# Patient Record
Sex: Female | Born: 2005 | Race: Black or African American | Hispanic: No | Marital: Single | Smoking: Never smoker
Health system: Southern US, Community
[De-identification: ages and names within clinical notes are randomized; demographics above are authoritative.]

---
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
## 2023-06-02 ENCOUNTER — Ambulatory Visit
Admission: EM | Admit: 2023-06-02 | Discharge: 2023-06-02 | Disposition: A | Discharge status: Home / Self Care | Payer: Medicaid Other | Attending: Internal Medicine | Admitting: Internal Medicine

## 2023-06-02 DIAGNOSIS — R112 Nausea with vomiting, unspecified: Secondary | ICD-10-CM

## 2023-06-02 DIAGNOSIS — B349 Viral infection, unspecified: Secondary | ICD-10-CM

## 2023-06-02 DIAGNOSIS — R6883 Chills (without fever): Secondary | ICD-10-CM | POA: Insufficient documentation

## 2023-06-02 LAB — POCT INFLUENZA A/B
Influenza A, POC: NEGATIVE
Influenza B, POC: NEGATIVE

## 2023-06-02 LAB — POCT RAPID STREP A (OFFICE): Rapid Strep A Screen: NEGATIVE

## 2023-06-02 MED ORDER — ONDANSETRON 4 MG PO TBDP: 4.0000 mg | ORAL_TABLET | Freq: Three times a day (TID) | ORAL | 0 refills | Status: DC | PRN

## 2023-06-02 NOTE — ED Triage Notes (Signed)
Pt states that she has some chills, headache, nausea and nasal congestion. [id number]

## 2023-06-02 NOTE — ED Provider Notes (Signed)
[name]    CSN: [id number] Arrival date & time: 06/02/23  1340      History   Chief Complaint Chief Complaint  Patient presents with   Chills    Chills, headache, nausea and nasal congestion. X2-3 days    HPI Renee [name] is a 17 y.o. female.   Patient presents with chills, headache, nausea, vomiting, nasal congestion, cough that started about 2 to 3 days ago.  Denies any documented fevers at home.  Reports that several people in her dorm at college have had similar symptoms.  She denies diarrhea, abdominal pain, chest pain, shortness of breath.  She has taken over-the-counter cold and flu medication for symptoms.  Reports that she has been able to tolerate fluids but has had decreased appetite.     History reviewed. No pertinent past medical history.  There are no problems to display for this patient.   History reviewed. No pertinent surgical history.  OB History   No obstetric history on file.      Home Medications    Prior to Admission medications   Medication Sig Start Date End Date Taking? Authorizing Provider  Acetaminophen (TYLENOL PO) Take 1 tablet by mouth 2 (two) times daily as needed (back pain).   Yes [provider]  Norethindrone-Ethinyl Estradiol-Fe Biphas (LO LOESTRIN FE) 1 MG-10 MCG / 10 MCG tablet Take 1 tablet by mouth daily. 03/24/22  Yes [provider]  ondansetron (ZOFRAN-ODT) 4 MG disintegrating tablet Take 1 tablet (4 mg total) by mouth every 8 (eight) hours as needed for nausea or vomiting. 06/02/23  Yes Hailei Besser, Acie Fredrickson, FNP  ibuprofen (CHILDRENS MOTRIN) 50 MG chewable tablet Chew 2 tablets (100 mg total) by mouth every 8 (eight) hours as needed for pain. 06/01/14   Fayrene Helper, PA-C    Family History History reviewed. No pertinent family history.  Social History Social History   Tobacco Use   Smoking status: Never  Vaping Use   Vaping status: Every Day  Substance Use Topics   Alcohol use: No    Drug use: No     Allergies   Patient has no known allergies.   Review of Systems Review of Systems Per HPI  Physical Exam Triage Vital Signs ED Triage Vitals  Encounter Vitals Group     BP 06/02/23 1457 130/80     Systolic BP Percentile --      Diastolic BP Percentile --      Pulse Rate 06/02/23 1457 67     Resp 06/02/23 1457 19     Temp 06/02/23 1457 98.1 F (36.7 C)     Temp Source 06/02/23 1457 Oral     SpO2 06/02/23 1457 100 %     Weight 06/02/23 1454 117 lb 12.8 oz (53.4 kg)     Height 06/02/23 1454 5\' 3"  (1.6 m)     Head Circumference --      Peak Flow --      Pain Score 06/02/23 1454 0     Pain Loc --      Pain Education --      Exclude from Growth Chart --    No data found.  Updated Vital Signs BP 130/80 (BP Location: Left Arm)   Pulse 67   Temp 98.1 F (36.7 C) (Oral)   Resp 19   Ht 5\' 3"  (1.6 m)   Wt 117 lb 12.8 oz (53.4 kg)   LMP 05/01/2023   SpO2 100%   BMI 20.87 kg/m  Visual Acuity Right Eye Distance:   Left Eye Distance:   Bilateral Distance:    Right Eye Near:   Left Eye Near:    Bilateral Near:     Physical Exam Constitutional:      General: She is not in acute distress.    Appearance: Normal appearance. She is not toxic-appearing or diaphoretic.  HENT:     Head: Normocephalic and atraumatic.     Right Ear: Tympanic membrane and ear canal normal.     Left Ear: Tympanic membrane and ear canal normal.     Nose: Congestion present.     Mouth/Throat:     Mouth: Mucous membranes are moist.     Pharynx: Posterior oropharyngeal erythema present. No oropharyngeal exudate.     Tonsils: No tonsillar exudate or tonsillar abscesses. 1+ on the right. 1+ on the left.  Eyes:     Extraocular Movements: Extraocular movements intact.     Conjunctiva/sclera: Conjunctivae normal.     Pupils: Pupils are equal, round, and reactive to light.  Cardiovascular:     Rate and Rhythm: Normal rate and regular rhythm.     Pulses: Normal pulses.      Heart sounds: Normal heart sounds.  Pulmonary:     Effort: Pulmonary effort is normal. No respiratory distress.     Breath sounds: Normal breath sounds. No wheezing.  Abdominal:     General: Abdomen is flat. Bowel sounds are normal.     Palpations: Abdomen is soft.  Musculoskeletal:        General: Normal range of motion.     Cervical back: Normal range of motion.  Skin:    General: Skin is warm and dry.  Neurological:     General: No focal deficit present.     Mental Status: She is alert and oriented to person, place, and time. Mental status is at baseline.  Psychiatric:        Mood and Affect: Mood normal.        Behavior: Behavior normal.      UC Treatments / Results  Labs (all labs ordered are listed, but only abnormal results are displayed) Labs Reviewed  POCT INFLUENZA A/B  POCT RAPID STREP A (OFFICE)    EKG   Radiology No results found.  Procedures Procedures (including critical care time)  Medications Ordered in UC Medications - No data to display  Initial Impression / Assessment and Plan / UC Course  I have reviewed the triage vital signs and the nursing notes.  Pertinent labs & imaging results that were available during my care of the patient were reviewed by me and considered in my medical decision making (see chart for details).     Suspect viral cause to patient's symptoms.  Rapid strep was completed given erythema noted on physical exam to posterior pharynx..  It was negative but throat culture is pending.  Rapid flu is negative.  Parent reported negative COVID testing at home so this testing was deferred.  Ondansetron prescribed to take as needed for nausea.  Advised adequate fluid hydration, rest, symptom management.  No signs of dehydration or acute abdomen on exam that would warrant emergent evaluation.  Advised strict return and ER precautions.  Patient and parent verbalized understanding and were agreeable with plan. Final Clinical Impressions(s)  / UC Diagnoses   Final diagnoses:  Viral illness  Nausea and vomiting, unspecified vomiting type     Discharge Instructions      Flu and rapid strep are negative.  Throat culture is pending.  Will call if it is abnormal.  As we discussed, your symptoms appear viral and should run their course.  I have prescribed you nausea medication to take as needed.  Push oral fluids and follow-up if any symptoms persist or worsen.     ED Prescriptions     Medication Sig Dispense Auth. Provider   ondansetron (ZOFRAN-ODT) 4 MG disintegrating tablet Take 1 tablet (4 mg total) by mouth every 8 (eight) hours as needed for nausea or vomiting. 20 tablet [place], Acie Fredrickson, [place]      PDMP not reviewed this encounter.   Gustavus Bryant, [place] 06/02/23 [phone]

## 2023-06-02 NOTE — Discharge Instructions (Signed)
Flu and rapid strep are negative.  Throat culture is pending.  Will call if it is abnormal.  As we discussed, your symptoms appear viral and should run their course.  I have prescribed you nausea medication to take as needed.  Push oral fluids and follow-up if any symptoms persist or worsen.

## 2023-06-05 LAB — CULTURE, GROUP A STREP (THRC)

## 2023-06-11 NOTE — Plan of Care (Signed)
[facility] Tonsillectomy/Adenoidectomy, Postoperative PEDS care plan entered in error.

## 2024-06-18 ENCOUNTER — Emergency Department (HOSPITAL_BASED_OUTPATIENT_CLINIC_OR_DEPARTMENT_OTHER)
Admission: EM | Admit: 2024-06-18 | Discharge: 2024-06-18 | Disposition: A | Discharge status: Home / Self Care | Attending: Emergency Medicine | Admitting: Emergency Medicine

## 2024-06-18 ENCOUNTER — Encounter (HOSPITAL_BASED_OUTPATIENT_CLINIC_OR_DEPARTMENT_OTHER): Payer: Self-pay | Admitting: Emergency Medicine

## 2024-06-18 DIAGNOSIS — B2790 Infectious mononucleosis, unspecified without complication: Secondary | ICD-10-CM | POA: Diagnosis not present

## 2024-06-18 DIAGNOSIS — J029 Acute pharyngitis, unspecified: Secondary | ICD-10-CM | POA: Diagnosis present

## 2024-06-18 LAB — CBC WITH DIFFERENTIAL/PLATELET
Abs Immature Granulocytes: 0.09 K/uL — ABNORMAL HIGH (ref 0.00–0.07)
Basophils Absolute: 0.1 K/uL (ref 0.0–0.1)
Basophils Relative: 1 %
Eosinophils Absolute: 0 K/uL (ref 0.0–0.5)
Eosinophils Relative: 0 %
HCT: 41.8 % (ref 36.0–46.0)
Hemoglobin: 14 g/dL (ref 12.0–15.0)
Immature Granulocytes: 1 %
Lymphocytes Relative: 50 %
Lymphs Abs: 9.1 K/uL — ABNORMAL HIGH (ref 0.7–4.0)
MCH: 29 pg (ref 26.0–34.0)
MCHC: 33.5 g/dL (ref 30.0–36.0)
MCV: 86.7 fL (ref 80.0–100.0)
Monocytes Absolute: 1.8 K/uL — ABNORMAL HIGH (ref 0.1–1.0)
Monocytes Relative: 10 %
Neutro Abs: 6.7 K/uL (ref 1.7–7.7)
Neutrophils Relative %: 38 %
Platelets: 205 K/uL (ref 150–400)
RBC: 4.82 MIL/uL (ref 3.87–5.11)
RDW: 12.2 % (ref 11.5–15.5)
Smear Review: NORMAL
WBC: 17.7 K/uL — ABNORMAL HIGH (ref 4.0–10.5)
nRBC: 0 % (ref 0.0–0.2)

## 2024-06-18 LAB — BASIC METABOLIC PANEL WITH GFR
Anion gap: 13 (ref 5–15)
BUN: 7 mg/dL (ref 6–20)
CO2: 22 mmol/L (ref 22–32)
Calcium: 8.9 mg/dL (ref 8.9–10.3)
Chloride: 103 mmol/L (ref 98–111)
Creatinine, Ser: 0.52 mg/dL (ref 0.44–1.00)
GFR, Estimated: >60 mL/min (ref >60)
Glucose, Bld: 116 mg/dL — ABNORMAL HIGH (ref 70–99)
Potassium: 3.6 mmol/L (ref 3.5–5.1)
Sodium: 138 mmol/L (ref 135–145)

## 2024-06-18 LAB — COMPREHENSIVE METABOLIC PANEL WITH GFR
ALT: 72 U/L — ABNORMAL HIGH (ref 0–44)
AST: 67 U/L — ABNORMAL HIGH (ref 15–41)
Albumin: 4.4 g/dL (ref 3.5–5.0)
Alkaline Phosphatase: 98 U/L (ref 38–126)
Anion gap: 17 — ABNORMAL HIGH (ref 5–15)
BUN: 9 mg/dL (ref 6–20)
CO2: 23 mmol/L (ref 22–32)
Calcium: 9.4 mg/dL (ref 8.9–10.3)
Chloride: 99 mmol/L (ref 98–111)
Creatinine, Ser: 0.58 mg/dL (ref 0.44–1.00)
GFR, Estimated: >60 mL/min (ref >60)
Glucose, Bld: 91 mg/dL (ref 70–99)
Potassium: 3.6 mmol/L (ref 3.5–5.1)
Sodium: 139 mmol/L (ref 135–145)
Total Bilirubin: 0.5 mg/dL (ref 0.0–1.2)
Total Protein: 9 g/dL — ABNORMAL HIGH (ref 6.5–8.1)

## 2024-06-18 LAB — RESP PANEL BY RT-PCR (RSV, FLU A&B, COVID)  RVPGX2
Influenza A by PCR: NEGATIVE
Influenza B by PCR: NEGATIVE
Resp Syncytial Virus by PCR: NEGATIVE
SARS Coronavirus 2 by RT PCR: NEGATIVE

## 2024-06-18 LAB — HCG, SERUM, QUALITATIVE: Preg, Serum: NEGATIVE

## 2024-06-18 LAB — LIPASE, BLOOD: Lipase: 21 U/L (ref 11–51)

## 2024-06-18 LAB — GROUP A STREP BY PCR: Group A Strep by PCR: NOT DETECTED

## 2024-06-18 MED ORDER — DEXAMETHASONE 4 MG PO TABS: 4.0000 mg | ORAL_TABLET | Freq: Two times a day (BID) | ORAL | 0 refills | Status: DC

## 2024-06-18 MED ORDER — KETOROLAC TROMETHAMINE 15 MG/ML IJ SOLN
15.0000 mg | Freq: Once | INTRAMUSCULAR | Status: AC
  Administered 2024-06-18: 15 mg via INTRAVENOUS
  Filled 2024-06-18: qty 1

## 2024-06-18 MED ORDER — LIDOCAINE VISCOUS HCL 2 % MT SOLN
15.0000 mL | Freq: Once | OROMUCOSAL | Status: AC
  Administered 2024-06-18: 15 mL via OROMUCOSAL
  Filled 2024-06-18: qty 15

## 2024-06-18 MED ORDER — ACETAMINOPHEN 500 MG PO TABS
1000.0000 mg | ORAL_TABLET | Freq: Once | ORAL | Status: AC
Start: 2024-06-18 — End: 2024-06-18
  Administered 2024-06-18: 1000 mg via ORAL
  Filled 2024-06-18: qty 2

## 2024-06-18 MED ORDER — ONDANSETRON HCL 4 MG PO TABS: 4.0000 mg | ORAL_TABLET | Freq: Three times a day (TID) | ORAL | 0 refills | Status: AC | PRN

## 2024-06-18 MED ORDER — DEXAMETHASONE SOD PHOSPHATE PF 10 MG/ML IJ SOLN
10.0000 mg | Freq: Once | INTRAMUSCULAR | Status: AC
  Administered 2024-06-18: 10 mg via INTRAVENOUS

## 2024-06-18 MED ORDER — METRONIDAZOLE 500 MG PO TABS: 500.0000 mg | ORAL_TABLET | Freq: Two times a day (BID) | ORAL | 0 refills | Status: DC

## 2024-06-18 MED ORDER — LACTATED RINGERS IV BOLUS
1000.0000 mL | Freq: Once | INTRAVENOUS | Status: AC
  Administered 2024-06-18: 1000 mL via INTRAVENOUS

## 2024-06-18 MED ORDER — ONDANSETRON HCL 4 MG/2ML IJ SOLN
4.0000 mg | Freq: Once | INTRAMUSCULAR | Status: AC
  Administered 2024-06-18: 4 mg via INTRAVENOUS
  Filled 2024-06-18: qty 2

## 2024-06-18 NOTE — ED Notes (Addendum)
 Patient updated on continued plan of care. Denies additional needs. Bed in lowest position and call light within reach.

## 2024-06-18 NOTE — ED Provider Notes (Signed)
 [name] Provider Note   CSN: [id number] Arrival date & time: 06/18/24  1015     History Chief Complaint  Patient presents with   Sore Throat    HPI: Renee [name] is a 18 y.o. female with no pertinent history who presents complaining of multiple symptoms. Patient arrived via POV accompanied by mother.  History provided by patient and parent.  No interpreter required during this encounter.  Patient reports that she has had several days of sore throat, headache, nausea, vomiting, epigastric discomfort.  Denies chest pain, shortness of breath, diarrhea.  Denies ear pain.  Does report that she has some cervical lymphadenopathy.  Reports that she recently was prescribed Flagyl for bacterial vaginosis and trichomoniasis, however has not Down all doses of this medication.  Does not have any chronic conditions.  Reports that she has difficulty tolerating liquids due to frequency of nausea and vomiting.  Patient's recorded medical, surgical, social, medication list and allergies were reviewed in the Snapshot window as part of the initial history.   Prior to Admission medications   Medication Sig Start Date End Date Taking? Authorizing Provider  dexamethasone (DECADRON) 4 MG tablet Take 1 tablet (4 mg total) by mouth 2 (two) times daily with a meal for 5 days. 06/18/24 06/23/24 Yes Renee Jerilynn RAMAN, MD  metroNIDAZOLE (FLAGYL) 500 MG tablet Take 1 tablet (500 mg total) by mouth 2 (two) times daily. 06/18/24  Yes Renee Jerilynn RAMAN, MD  ondansetron  (ZOFRAN ) 4 MG tablet Take 1 tablet (4 mg total) by mouth every 8 (eight) hours as needed for nausea or vomiting. 06/18/24  Yes Renee Jerilynn RAMAN, MD  Acetaminophen (TYLENOL PO) Take 1 tablet by mouth 2 (two) times daily as needed (back pain).    [provider]  ibuprofen  (CHILDRENS MOTRIN ) 50 MG chewable tablet Chew 2 tablets (100 mg total) by mouth every 8 (eight) hours as needed for pain. 06/01/14    Renee Colon, PA-C  [name] (LO LOESTRIN FE) 1 MG-10 MCG / 10 MCG tablet Take 1 tablet by mouth daily. 03/24/22   [provider]  ondansetron  (ZOFRAN -ODT) 4 MG disintegrating tablet Take 1 tablet (4 mg total) by mouth every 8 (eight) hours as needed for nausea or vomiting. 06/02/23   Renee [name], Renee [name], Renee [name]     Allergies: Patient has no known allergies.   Review of Systems   ROS as per HPI  Physical Exam Updated Vital Signs BP 126/76 (BP Location: Right Arm)   Pulse 66   Temp 97.6 F (36.4 C) (Oral)   Resp 16   Wt 51.7 kg   LMP 05/22/2024   SpO2 98%   BMI 20.19 kg/m  Physical Exam Vitals and nursing note reviewed.  Constitutional:      General: She is not in acute distress.    Appearance: She is well-developed.  HENT:     Head: Normocephalic and atraumatic.     Right Ear: Tympanic membrane normal.     Left Ear: Tympanic membrane normal.     Mouth/Throat:     Pharynx: Posterior oropharyngeal erythema present.     Tonsils: Tonsillar exudate present. 4+ on the right. 4+ on the left.  Eyes:     Conjunctiva/sclera: Conjunctivae normal.  Cardiovascular:     Rate and Rhythm: Normal rate and regular rhythm.     Heart sounds: No murmur heard. Pulmonary:     Effort: Pulmonary effort is normal. No respiratory distress.  Breath sounds: Normal breath sounds.  Abdominal:     Palpations: Abdomen is soft.     Tenderness: There is no abdominal tenderness.  Musculoskeletal:        General: No swelling.     Cervical back: Neck supple.  Skin:    General: Skin is warm and dry.     Capillary Refill: Capillary refill takes less than 2 seconds.  Neurological:     Mental Status: She is alert.  Psychiatric:        Mood and Affect: Mood normal.     ED Course/ Medical Decision Making/ A&P    Procedures Procedures   Medications Ordered in ED Medications  acetaminophen (TYLENOL) tablet 1,000 mg (1,000 mg Oral Given 06/18/24 1156)   ondansetron  (ZOFRAN ) injection 4 mg (4 mg Intravenous Given 06/18/24 1135)  lactated ringers bolus 1,000 mL (0 mLs Intravenous Stopped 06/18/24 1244)  dexamethasone (DECADRON) injection 10 mg (10 mg Intravenous Given 06/18/24 1137)  lidocaine (XYLOCAINE) 2 % viscous mouth solution 15 mL (15 mLs Mouth/Throat Given 06/18/24 1123)  lactated ringers bolus 1,000 mL (0 mLs Intravenous Stopped 06/18/24 1400)  ketorolac (TORADOL) 15 MG/ML injection 15 mg (15 mg Intravenous Given 06/18/24 1235)    Medical Decision Making:   Renee [name] is a 18 y.o. female who presents for multiple symptoms as per above.  Physical exam is pertinent for 4+ tonsils with significant erythema and exudates.   The differential includes but is not limited to strep pharyngitis, viral pharyngitis, dehydration, AKI.  Independent historian: Parent  External data reviewed: Notes: Reviewed patient's outpatient urgent care notes, patient unfortunately had negative strep test yesterday as well as positive mononucleosis test  Initial Plan:   Screening labs including CBC and Metabolic panel to evaluate for infectious or metabolic etiology of disease.  Strep swab, COVID/flu/RSV   Labs: Ordered, Independent interpretation, and Details: Strep test negative, serum pregnancy negative.  CBC with leukocytosis to 17.7, hemoglobin of 14, no thrombocytopenia. Patient has a history of mild anemia with baseline hemoglobin of approximately 12, therefore do feel that there is likely component of hemoconcentration contributing to patient's leukocytosis given patient also has increased hemoglobin from baseline.  Abnormal white blood cells noted on smear are likely secondary to known mononucleosis infection.  CMP with anion gap elevation, however no AKI, emergent electro gentian, emergent LFT abnormality.  Lipase WNL.  COVID/flu/RSV negative.  Radiology: Not indicated No results found.  EKG/Medicine tests: Not indicated EKG Interpretation:                   Interventions: Viscous lidocaine, Zofran , Decadron, Tylenol, Toradol, 2 L LR bolus  See the EMR for full details regarding lab and imaging results.  Patient with numerous symptoms consistent with potential viral etiology, has significant tonsillomegaly likely contributing to patient's sore throat.  Will treat symptomatically with lidocaine, Decadron, Tylenol.  Given patient reports that she has not been tolerating p.o., will obtain labs and give fluid bolus.  Unfortunately patient had strep screen ordered in triage prior to me being able to evaluate patient's recent outside notes which do note that patient had negative strep test on 10/30 and positive mononucleosis screen, thus patient's tonsillar symptoms are most likely due to mononucleosis, as well as her systemic symptoms.  Labs obtained and patient does have significant anion gap elevation, therefore additional LR bolus ordered.  Patient CBC also demonstrates evidence of hemoconcentration consistent with dehydration.  Patient with symptomatic improvement after multimodal pain therapy, and p.o. was trialed with  success.  Patient continues to have significant tonsillomegaly, therefore will discharge with Decadron to continue to manage symptoms, as well as Zofran  for nausea/vomiting.  Recommended pain control otherwise with Tylenol and ibuprofen .  Patient does not have any unilateral symptoms concerning for PTA, RPA, therefore do not feel the patient warrants additional imaging at this time.  Discussed contact sport precautions given association of splenomegaly/splenic rupture with mononucleosis, and patient and daughter expressed understanding.  Feel that patient is stable for discharge and outpatient management, with follow-up with PCP.  Additionally will represcribe patient's course of Flagyl for her recent trichomoniasis/bacterial vaginosis diagnosis given she was not able to take the Flagyl as prescribed due to her  nausea/vomiting.  Presentation is most consistent with acute complicated illness and I did consider and rule out acute life/limb-threatening illness  Discussion of management or test interpretations with external provider(s): Not indicated  Risk Drugs:OTC drugs and Prescription drug management  Disposition: DISCHARGE: I believe that the patient is safe for discharge home with outpatient follow-up. Patient was informed of all pertinent physical exam, laboratory, and imaging findings. Patient's suspected etiology of their symptom presentation was discussed with the patient and all questions were answered. We discussed following up with PCP. I provided thorough ED return precautions. The patient feels safe and comfortable with this plan.  MDM generated using voice dictation software and may contain dictation errors.  Please contact me for any clarification or with any questions.  Clinical Impression:  1. Mononucleosis syndrome      Discharge   Final Clinical Impression(s) / ED Diagnoses Final diagnoses:  Mononucleosis syndrome    Rx / DC Orders ED Discharge Orders          Ordered    dexamethasone (DECADRON) 4 MG tablet  2 times daily with meals        06/18/24 1509    ondansetron  (ZOFRAN ) 4 MG tablet  Every 8 hours PRN        06/18/24 1509    metroNIDAZOLE (FLAGYL) 500 MG tablet  2 times daily        06/18/24 1510             Renee Jerilynn RAMAN, MD 06/18/24 1531

## 2024-06-18 NOTE — Discharge Instructions (Addendum)
 [name]  Thank you for allowing us  to take care of you today.  You came to the Emergency Department today because you came in with several symptoms including sore throat, headache, and persistent nausea and vomiting.  You were negative for strep throat here as well as COVID, flu, RSV.  It looks like you were recently positive for mononucleosis on 10/30 at urgent care.  This is likely the cause of all of your symptoms.  You are initially dehydrated, however you did not have any abnormal levels of your blood salts, and your kidney number was normal.  We gave you fluids and you are feeling much better and your metabolic panel normalized.  You are feeling better after some Tylenol, NSAIDs, and some steroids to help decrease the swelling in your tonsils.  We are going to prescribe you a steroid pack to help control the swelling in your tonsils over the next Avril days.  As well as a prescription for Zofran , which is a medication that will help reduce nausea and vomiting.  Please be aware that the steroid can decrease the efficacy of your birth control, therefore you should use a backup method such as condoms until you finish your steroids and for at least 1 week after.  We are also represcribing your Flagyl given you did not tolerate the full course that you are previously prescribed.  Your symptoms will likely last for several more days or even weeks as mono can take a long time to improve.  Please also be aware that in addition to making your tonsils large it can cause enlargement of your spleen, therefore you should not participate in any contact sports until your symptoms have fully resolved for a few weeks.  If you participate in contact sports and then have severe abdominal pain, that would be a reason to come back to the emergency department.  To-Do: 1. Please follow-up with your primary doctor within 1 - 2 weeks / as soon as possible.    Please return to the Emergency Department or call 911  if you experience have worsening of your symptoms, or do not get better, inability to drink water by mouth despite Zofran , steroids, Tylenol and ibuprofen , chest pain, shortness of breath, severe or significantly worsening pain, high fever, severe confusion, pass out or have any reason to think that you need emergency medical care.   We hope you feel better soon.   Mitzie Later, MD Department of Emergency Medicine [facility]

## 2024-06-18 NOTE — ED Notes (Signed)
 Patient tolerated PO fluid challenge well. Denies any episodes of nausea or vomiting.

## 2024-06-18 NOTE — ED Triage Notes (Signed)
 Pt c/o sore throat x 6 days, endorse difficulty  swallowing, able to swallow secretions. Motrin  pta, 400 mg

## 2024-06-20 ENCOUNTER — Emergency Department (HOSPITAL_COMMUNITY)

## 2024-06-20 ENCOUNTER — Other Ambulatory Visit: Payer: Self-pay

## 2024-06-20 ENCOUNTER — Inpatient Hospital Stay (HOSPITAL_COMMUNITY)
Admission: EM | Admit: 2024-06-20 | Discharge: 2024-06-24 | DRG: 155 | Disposition: A | Discharge status: Home / Self Care | Attending: Internal Medicine | Admitting: Internal Medicine

## 2024-06-20 DIAGNOSIS — Z5320 Procedure and treatment not carried out because of patient's decision for unspecified reasons: Secondary | ICD-10-CM | POA: Diagnosis present

## 2024-06-20 DIAGNOSIS — Z793 Long term (current) use of hormonal contraceptives: Secondary | ICD-10-CM

## 2024-06-20 DIAGNOSIS — E861 Hypovolemia: Secondary | ICD-10-CM | POA: Diagnosis present

## 2024-06-20 DIAGNOSIS — D72829 Elevated white blood cell count, unspecified: Secondary | ICD-10-CM | POA: Diagnosis present

## 2024-06-20 DIAGNOSIS — B2799 Infectious mononucleosis, unspecified with other complication: Principal | ICD-10-CM | POA: Diagnosis present

## 2024-06-20 DIAGNOSIS — R0603 Acute respiratory distress: Secondary | ICD-10-CM | POA: Diagnosis present

## 2024-06-20 DIAGNOSIS — R7401 Elevation of levels of liver transaminase levels: Secondary | ICD-10-CM | POA: Diagnosis present

## 2024-06-20 DIAGNOSIS — J353 Hypertrophy of tonsils with hypertrophy of adenoids: Principal | ICD-10-CM | POA: Diagnosis present

## 2024-06-20 DIAGNOSIS — J988 Other specified respiratory disorders: Secondary | ICD-10-CM

## 2024-06-20 DIAGNOSIS — J039 Acute tonsillitis, unspecified: Secondary | ICD-10-CM | POA: Diagnosis present

## 2024-06-20 DIAGNOSIS — E871 Hypo-osmolality and hyponatremia: Secondary | ICD-10-CM | POA: Diagnosis present

## 2024-06-20 DIAGNOSIS — Z79899 Other long term (current) drug therapy: Secondary | ICD-10-CM

## 2024-06-20 DIAGNOSIS — R590 Localized enlarged lymph nodes: Secondary | ICD-10-CM | POA: Diagnosis present

## 2024-06-20 DIAGNOSIS — T380X5A Adverse effect of glucocorticoids and synthetic analogues, initial encounter: Secondary | ICD-10-CM | POA: Diagnosis present

## 2024-06-20 DIAGNOSIS — B2789 Other infectious mononucleosis with other complication: Principal | ICD-10-CM | POA: Diagnosis present

## 2024-06-20 LAB — BASIC METABOLIC PANEL WITH GFR
Anion gap: 11 (ref 5–15)
BUN: 7 mg/dL (ref 6–20)
CO2: 24 mmol/L (ref 22–32)
Calcium: 8.9 mg/dL (ref 8.9–10.3)
Chloride: 97 mmol/L — ABNORMAL LOW (ref 98–111)
Creatinine, Ser: 0.61 mg/dL (ref 0.44–1.00)
GFR, Estimated: >60 mL/min (ref >60)
Glucose, Bld: 93 mg/dL (ref 70–99)
Potassium: 4.6 mmol/L (ref 3.5–5.1)
Sodium: 133 mmol/L — ABNORMAL LOW (ref 135–145)

## 2024-06-20 LAB — CBC WITH DIFFERENTIAL/PLATELET
Basophils Absolute: 0.3 K/uL — ABNORMAL HIGH (ref 0.0–0.1)
Basophils Relative: 1 %
Eosinophils Absolute: 0 K/uL (ref 0.0–0.5)
Eosinophils Relative: 0 %
HCT: 42.5 % (ref 36.0–46.0)
Hemoglobin: 13.4 g/dL (ref 12.0–15.0)
Lymphocytes Relative: 38 %
Lymphs Abs: 10.3 K/uL — ABNORMAL HIGH (ref 0.7–4.0)
MCH: 28.1 pg (ref 26.0–34.0)
MCHC: 31.5 g/dL (ref 30.0–36.0)
MCV: 89.1 fL (ref 80.0–100.0)
Monocytes Absolute: 0.3 K/uL (ref 0.1–1.0)
Monocytes Relative: 1 %
Neutro Abs: 16.3 K/uL — ABNORMAL HIGH (ref 1.7–7.7)
Neutrophils Relative %: 60 %
Platelets: 245 K/uL (ref 150–400)
RBC: 4.77 MIL/uL (ref 3.87–5.11)
RDW: 12.7 % (ref 11.5–15.5)
WBC: 27.2 K/uL — ABNORMAL HIGH (ref 4.0–10.5)
nRBC: 0 % (ref 0.0–0.2)

## 2024-06-20 LAB — HCG, SERUM, QUALITATIVE: Preg, Serum: NEGATIVE

## 2024-06-20 LAB — PATHOLOGIST SMEAR REVIEW: Path Review: REACTIVE

## 2024-06-20 MED ORDER — RACEPINEPHRINE HCL 2.25 % IN NEBU
0.5000 mL | INHALATION_SOLUTION | Freq: Once | RESPIRATORY_TRACT | Status: AC
  Administered 2024-06-20: 0.5 mL via RESPIRATORY_TRACT
  Filled 2024-06-20: qty 15

## 2024-06-20 MED ORDER — SODIUM CHLORIDE 0.9 % IV SOLN
3.0000 g | Freq: Once | INTRAVENOUS | Status: AC
  Administered 2024-06-20: 3 g via INTRAVENOUS
  Filled 2024-06-20: qty 8

## 2024-06-20 MED ORDER — IOHEXOL 300 MG/ML  SOLN
75.0000 mL | Freq: Once | INTRAMUSCULAR | Status: AC | PRN
  Administered 2024-06-20: 75 mL via INTRAVENOUS

## 2024-06-20 MED ORDER — LACTATED RINGERS IV BOLUS
1000.0000 mL | Freq: Once | INTRAVENOUS | Status: AC
  Administered 2024-06-20: 1000 mL via INTRAVENOUS

## 2024-06-20 MED ORDER — DEXAMETHASONE SOD PHOSPHATE PF 10 MG/ML IJ SOLN
10.0000 mg | Freq: Once | INTRAMUSCULAR | Status: AC
  Administered 2024-06-20: 10 mg via INTRAVENOUS

## 2024-06-20 NOTE — ED Notes (Signed)
 Contacted [facility] for transfer.

## 2024-06-20 NOTE — Progress Notes (Signed)
 ED Pharmacy Antibiotic Sign Off An antibiotic consult was received from an ED provider for Unasyn per pharmacy dosing for tonsillar infection. A chart review was completed to assess appropriateness.   The following one time order(s) were placed:  Unasyn 3 g  Further antibiotic and/or antibiotic pharmacy consults should be ordered by the admitting provider if indicated.   Thank you for allowing pharmacy to be a part of this patient's care.   Stefano MARLA Bologna, PharmD, BCPS Clinical Pharmacist 06/20/2024 9:10 PM

## 2024-06-20 NOTE — ED Provider Notes (Signed)
 [place] EMERGENCY DEPARTMENT AT [facility] Provider Note   CSN: [id number] Arrival date & time: 06/20/24  2015     Patient presents with: Oral Swelling   Renee [name] is a 18 y.o. female.  18 year old female presents ED with complaints of throat swelling, shortness of breath, and difficulty eating and drinking.  Patient advises she was seen at urgent care Thursday and then went to the ER on Saturday.  Patient was given steroids and symptoms were treated in ED.  Patient was able to tolerate p.o. challenge at that time and was discharged with diagnosis of mono.  Mother reports that has gotten worse since discharge. She advised patient has not been able to eat or drink and she is now having difficulties with breathing.  Patient does have some difficulty handling secretions as well.  Mother reports she has had some mild fever at home.       Prior to Admission medications   Medication Sig Start Date End Date Taking? Authorizing Provider  Acetaminophen (TYLENOL PO) Take 1 tablet by mouth 2 (two) times daily as needed (back pain).    [provider]  dexamethasone (DECADRON) 4 MG tablet Take 1 tablet (4 mg total) by mouth 2 (two) times daily with a meal for 5 days. 06/18/24 06/23/24  Rogelia Jerilynn RAMAN, MD  ibuprofen  (CHILDRENS MOTRIN ) 50 MG chewable tablet Chew 2 tablets (100 mg total) by mouth every 8 (eight) hours as needed for pain. 06/01/14   Nivia Colon, PA-C  metroNIDAZOLE (FLAGYL) 500 MG tablet Take 1 tablet (500 mg total) by mouth 2 (two) times daily. 06/18/24   Rogelia Jerilynn RAMAN, MD  Norethindrone-Ethinyl Estradiol-Fe Biphas (LO LOESTRIN FE) 1 MG-10 MCG / 10 MCG tablet Take 1 tablet by mouth daily. 03/24/22   [provider]  ondansetron  (ZOFRAN ) 4 MG tablet Take 1 tablet (4 mg total) by mouth every 8 (eight) hours as needed for nausea or vomiting. 06/18/24   Rogelia Jerilynn RAMAN, MD  ondansetron  (ZOFRAN -ODT) 4 MG disintegrating tablet Take 1 tablet (4 mg  total) by mouth every 8 (eight) hours as needed for nausea or vomiting. 06/02/23   Mound, Haley E, FNP    Allergies: Patient has no known allergies.    Review of Systems  HENT:  Positive for drooling, trouble swallowing and voice change.   Respiratory:  Positive for shortness of breath.   All other systems reviewed and are negative.   Updated Vital Signs BP 136/75 (BP Location: Right Arm)   Pulse 94   Temp 99.2 F (37.3 C) (Oral)   Resp 19   LMP 05/22/2024   SpO2 99%   Physical Exam Vitals and nursing note reviewed.  Constitutional:      Appearance: Normal appearance. She is ill-appearing.  HENT:     Head: Normocephalic and atraumatic.     Nose: Nose normal.     Mouth/Throat:     Pharynx: Oropharyngeal exudate and posterior oropharyngeal erythema present.     Comments: Significant swelling in her tonsils with large amounts of exudates.  Patient is unable to handle secretions appropriately. Neck:     Comments: Significant lymphadenopathy noted throughout. Cardiovascular:     Rate and Rhythm: Normal rate.  Pulmonary:     Breath sounds: Stridor present. Rhonchi present.     Comments: Rhonchi noted in upper lobes. Abdominal:     General: Abdomen is flat.     Tenderness: There is no abdominal tenderness. There is no guarding.  Musculoskeletal:  Cervical back: Tenderness present.  Skin:    General: Skin is warm.     Capillary Refill: Capillary refill takes less than 2 seconds.  Neurological:     General: No focal deficit present.     Mental Status: She is alert.     (all labs ordered are listed, but only abnormal results are displayed) Labs Reviewed  BASIC METABOLIC PANEL WITH GFR - Abnormal; Notable for the following components:      Result Value   Sodium 133 (*)    Chloride 97 (*)    All other components within normal limits  CBC WITH DIFFERENTIAL/PLATELET - Abnormal; Notable for the following components:   WBC 27.2 (*)    Neutro Abs 16.3 (*)    Lymphs Abs  10.3 (*)    Basophils Absolute 0.3 (*)    All other components within normal limits  HCG, SERUM, QUALITATIVE    EKG: None  Radiology: DG Chest 2 View Result Date: 06/20/2024 EXAM: 2 VIEW(S) XRAY OF THE CHEST 06/20/2024 09:23:00 PM COMPARISON: None available. CLINICAL HISTORY: 141880 SOB (shortness of breath) 141880 SOB (shortness of breath) FINDINGS: LUNGS AND PLEURA: No focal pulmonary opacity. No pulmonary edema. No pleural effusion. No pneumothorax. HEART AND MEDIASTINUM: No acute abnormality of the cardiac and mediastinal silhouettes. BONES AND SOFT TISSUES: No acute osseous abnormality. IMPRESSION: 1. No acute process. Electronically signed by: Morgane Naveau MD 06/20/2024 09:26 PM EST RP Workstation: [id number]     Procedures   Medications Ordered in the ED  dexamethasone (DECADRON) injection 10 mg (10 mg Intravenous Given 06/20/24 2145)  Ampicillin-Sulbactam (UNASYN) 3 g in sodium chloride 0.9 % 100 mL IVPB (0 g Intravenous Stopped 06/20/24 2220)  iohexol (OMNIPAQUE) 300 MG/ML solution 75 mL (75 mLs Intravenous Contrast Given 06/20/24 2225)  Racepinephrine HCl 2.25 % nebulizer solution 0.5 mL (0.5 mLs Nebulization Given 06/20/24 2236)    18 y.o. female presents to the ED with complaints of throat swelling, shortness of breath, and difficulty eating and drinking, this involves an extensive number of treatment options, and is a complaint that carries with it a high risk of complications and morbidity.  The differential diagnosis includes airway compromise,  infection of soft tissue, pneumonia, Ludewig's angina, mono, strep pharyngitis, (Ddx)  On arrival pt appears ill and having difficulties handling secretion in triage patient reports some shortness of breath and O2 saturation is at 99% with muffled speaking, vitals unremarkable. Exam significant for rhonchi noted throughout upper lobes.  Severe swelling noted in tonsils with significant swelling noted throughout mandible.  Additional  history obtained from chart review significant for patient being seen at family medicine on 10/30 with diagnosis of mono.  I ordered medication Unasyn and Decadron for swelling and potential soft tissue neck infection.  Lab Tests:  I Ordered, reviewed, and interpreted labs, which included: BMP, CBC, hCG qualitative. CBC remarkable for leukocytosis of 27.2.   ED Course:   On initial evaluation in triage patient appears ill not being able to handle secretions appropriately with significant swelling noted in tonsils and palpable swelling noted throughout mandible.  Lungs were remarkable for some upper lobe rhonchi oxygen saturation was 99% on room air.  Was discussed with Dr. Mannie and he advised to start antibiotics and Decadron and have patient roomed to be further evaluated. He also advised to order CT soft tissue.  Dr. Mannie further evaluated patient and advised he would assume care of patient due to swelling of upper airway.    Portions of this note  were generated with Scientist, clinical (histocompatibility and immunogenetics). Dictation errors may occur despite best attempts at proofreading.    Final diagnoses:  Infectious mononucleosis, with other complication, infectious mononucleosis due to unspecified organism  Airway compromise    ED Discharge Orders     None          Myriam Fonda GORMAN DEVONNA 06/20/24 2255    Mannie Pac T, DO 06/21/24 2106

## 2024-06-20 NOTE — Telephone Encounter (Signed)
 Copied from CRM #[id number]. Topic: Clinical Concerns - Medical Question >> Jun 20, 2024 10:21 AM Renee [name] wrote: [name], Renee A is calling other request    Include all details related to the request(s) below: Patient is on an antibiotic & it's causing her to throw up. Worried not getting any of it in system. Says every time she takes an antibiotic she throws up. Says she's been seen by pcp & by the ER & not sure what to do next. Asking for call back    Confirm and type the Best Contact Number below:  Patient/caller contact number:  [phone]           [] Home  [x] Mobile  [] Work  []  Other   []  Okay to leave a voicemail   Medication List:  Current Outpatient Medications:  .  metroNIDAZOLE (FLAGYL) 500 mg tablet, Take 1 tablet (500 mg total) by mouth 2 (two) times a day for 7 days., Disp: 14 tablet, Rfl: 0     Medication Request/Refills: Pharmacy Information (if applicable)   []  Not Applicable       [x]  Pharmacy listed  Send Medication Request to: [facility] [address]                                                 []  Pharmacy not listed (added to pharmacy list in Epic) Send Medication Request to:      Listed Pharmacies: [id number] [place], [place] - [address] - PHONE: [phone] FAX: [phone]

## 2024-06-20 NOTE — Consult Note (Signed)
 ENT CONSULT:  Reason for Consult: Airway obstruction  Referring Physician: Emergency department  HPI: Renee [name] is an 18 y.o. female who developed a worsening sore throat and tightness in her throat along with difficulty breathing.  She reports that this started 2 days ago and then worsened over the last 2 days and tested positive for mononucleosis.  She was seen at [facility] earlier this evening is noted to have severe tonsillar hypertrophy and some respiratory distress that was transferred for airway management.  She has not been able to eat much today and feels that her symptoms are fairly stable since receiving another dose of steroids earlier this evening.   No past medical history on file.  No past surgical history on file.  No family history on file.  Social History:  reports that she has never smoked. She does not have any smokeless tobacco history on file. She reports that she does not drink alcohol and does not use drugs.  Allergies: No Known Allergies  Medications: I have reviewed the patient's current medications.  Results for orders placed or performed during the hospital encounter of 06/20/24 (from the past 48 hours)  Basic metabolic panel     Status: Abnormal   Collection Time: 06/20/24  9:05 PM  Result Value Ref Range   Sodium 133 (L) 135 - 145 mmol/L   Potassium 4.6 3.5 - 5.1 mmol/L   Chloride 97 (L) 98 - 111 mmol/L   CO2 24 22 - 32 mmol/L   Glucose, Bld 93 70 - 99 mg/dL    Comment: Glucose reference range applies only to samples taken after fasting for at least 8 hours.   BUN 7 6 - 20 mg/dL   Creatinine, Ser 9.38 0.44 - 1.00 mg/dL   Calcium 8.9 8.9 - 89.6 mg/dL   GFR, Estimated >39 >39 mL/min    Comment: (NOTE) Calculated using the CKD-EPI Creatinine Equation (2021)    Anion gap 11 5 - 15    Comment: Performed at [facility], [address] [place], [place] [address]  CBC with Differential     Status: Abnormal   Collection Time:  06/20/24  9:05 PM  Result Value Ref Range   WBC 27.2 (H) 4.0 - 10.5 K/uL   RBC 4.77 3.87 - 5.11 MIL/uL   Hemoglobin 13.4 12.0 - 15.0 g/dL   HCT 57.4 63.9 - 53.9 %   MCV 89.1 80.0 - 100.0 fL   MCH 28.1 26.0 - 34.0 pg   MCHC 31.5 30.0 - 36.0 g/dL   RDW 87.2 88.4 - 84.4 %   Platelets 245 150 - 400 K/uL   nRBC 0.0 0.0 - 0.2 %   Neutrophils Relative % 60 %   Neutro Abs 16.3 (H) 1.7 - 7.7 K/uL   Lymphocytes Relative 38 %   Lymphs Abs 10.3 (H) 0.7 - 4.0 K/uL   Monocytes Relative 1 %   Monocytes Absolute 0.3 0.1 - 1.0 K/uL   Eosinophils Relative 0 %   Eosinophils Absolute 0.0 0.0 - 0.5 K/uL   Basophils Relative 1 %   Basophils Absolute 0.3 (H) 0.0 - 0.1 K/uL   Ovalocytes PRESENT     Comment: Performed at [facility], [address] [place], [place] [address]  hCG, serum, qualitative     Status: None   Collection Time: 06/20/24  9:05 PM  Result Value Ref Range   Preg, Serum NEGATIVE NEGATIVE    Comment:  THE SENSITIVITY OF THIS METHODOLOGY IS >10 mIU/mL. Performed at [facility], [address] [place], [place] [address]     CT Soft Tissue Neck W Contrast Result Date: 06/20/2024 EXAM: CT NECK WITH CONTRAST 06/20/2024 10:33:01 PM TECHNIQUE: CT of the neck was performed with the administration of intravenous contrast. Multiplanar reformatted images are provided for review. Automated exposure control, iterative reconstruction, and/or weight based adjustment of the mA/kV was utilized to reduce the radiation dose to as low as reasonably achievable. COMPARISON: None available. CLINICAL HISTORY: Soft tissue infection suspected, neck, xray done FINDINGS: AERODIGESTIVE TRACT: Enlarged tonsils with striated enhancement. No discrete, drainable fluid collection. SALIVARY GLANDS: The parotid and submandibular glands are unremarkable. THYROID: Unremarkable. LYMPH NODES: Marked lymphadenopathy throughout the neck bilaterally. SOFT TISSUES: No mass or fluid  collection. BRAIN, ORBITS, SINUSES AND MASTOIDS: No acute abnormality. LUNGS AND MEDIASTINUM: No acute abnormality. BONES: No focal bone abnormality. IMPRESSION: 1. Enlarged tonsils with striated enhancement, compatible with tonsillitis. No discrete, drainable fluid collection. 2. Marked lymphadenopathy throughout the neck bilaterally, nonspecific but potentially reactive given the above findings. Recommend close clinical follow-up to ensure resolution and to exclude a lymphoproliferative disorder. Electronically signed by: Gilmore Molt MD 06/20/2024 10:54 PM EST RP Workstation: [id number]   DG Chest 2 View Result Date: 06/20/2024 EXAM: 2 VIEW(S) XRAY OF THE CHEST 06/20/2024 09:23:00 PM COMPARISON: None available. CLINICAL HISTORY: 141880 SOB (shortness of breath) 141880 SOB (shortness of breath) FINDINGS: LUNGS AND PLEURA: No focal pulmonary opacity. No pulmonary edema. No pleural effusion. No pneumothorax. HEART AND MEDIASTINUM: No acute abnormality of the cardiac and mediastinal silhouettes. BONES AND SOFT TISSUES: No acute osseous abnormality. IMPRESSION: 1. No acute process. Electronically signed by: Morgane Naveau MD 06/20/2024 09:26 PM EST RP Workstation: [id number]    MND:wzhjupcz other than stated per HPI  Blood pressure 135/82, pulse 94, temperature 99.4 F (37.4 C), temperature source Oral, resp. rate (!) 24, last menstrual period 05/22/2024, SpO2 100%.  PHYSICAL EXAM:  CONSTITUTIONAL: well developed, nourished, no distress and alert and oriented x 3 EYES: PERRL, EOMI  HENT: Head : normocephalic and atraumatic Ears: Right ear:   canal normal, external ear normal and hearing normal Left ear:   canal normal, external ear normal and hearing normal Nose: nose normal and no purulence, complete nasal obstruction without nasal airflow. Mouth/Throat: 4+ tonsils NECK: Diffuse bilateral cervical adenopathy  Studies Reviewed: CT images were independently reviewed and showed marked  adenotonsillar hypertrophy with no airway obstruction at the level of the larynx or trachea.  Assessment/Plan: Upper airway obstruction - I discussed the case with the treating ER physicians. She has severe adenotonsillar hypertrophy secondary to active mononucleosis.  CT images were independently reviewed and did not show any airway obstruction at the level of the larynx or trachea.  I recommend continued high-dose steroids for at least 24 hours and to initiate BiPAP as she likely has acute onset severe sleep apnea due to the adenotonsillar hypertrophy.   Zach Persia Lintner MD  06/20/2024, 11:36 PM

## 2024-06-20 NOTE — ED Triage Notes (Signed)
 Patient c/o oral and throat swelling x 1 week. Patient report she was seen 2 days ago and discharge with steroids and antibiotics with no relief. Patient report she feel's its getting worse and unable to take her oral antibiotics d/t swelling. Patient report SOB. Patient denies Chest pain.

## 2024-06-20 NOTE — ED Notes (Signed)
 Patient arrives via [facility] from [facility] for ENT consult for airway protection. Recently diagnosed with mono - given steroids and antibiotics but oral swelling has increased. Significant swelling noted on arrival. VSS. 20G RW.

## 2024-06-20 NOTE — Telephone Encounter (Signed)
  Nurse Live Chat Note  Person initiating chat was identified as (name and number):   Chat was initiated for: (Question or concern) Medicine question  Care Gaps Addressed: WCC and Vaccines UTD per chart review   Background:   Assessment:   Summary and Recommendation: Stopped responding in chat. Notified to reach back out to us  if needed. Chat ended.    Pharmacy:  [id number] [address], [place] - [address] - PHONE: [phone] - FAX: [phone]   Verified pharmacy?     If routed to office, reason:      A copy of the chat text may be included below.

## 2024-06-20 NOTE — ED Provider Notes (Signed)
 Patient here with shortness of breath, airway swelling.  She was discharged with antibiotics and oral steroids after diagnosis of mononucleosis.  She feels that she is getting worse, has been having difficult time breathing and swallowing.  On my evaluation of the patient, she has significant tonsillar edema, pooling of secretions.  Patient with a hot potato voice.  She has some very faint stridor.  She has received steroids, Unasyn.  Spoke with ENT, Dr. Maggie.  We both agree that the mononucleosis would be an unusual because of significant upper airway compromise, however the patient does have such significant tonsillar swelling that is a concern.  He recommended racemic epi in addition to the steroids that the patient has received.  At this time, patient's airway is intact, but she has little room for worsening swelling.  She has been sent to ED to ED for emergent ENT evaluation.  Accepting ED physician is Dr. Pamella.   Mannie Pac T, DO 06/20/24 2234

## 2024-06-21 ENCOUNTER — Encounter (HOSPITAL_COMMUNITY): Payer: Self-pay | Admitting: Internal Medicine

## 2024-06-21 DIAGNOSIS — E871 Hypo-osmolality and hyponatremia: Secondary | ICD-10-CM | POA: Diagnosis present

## 2024-06-21 DIAGNOSIS — B2789 Other infectious mononucleosis with other complication: Secondary | ICD-10-CM | POA: Diagnosis present

## 2024-06-21 DIAGNOSIS — R0603 Acute respiratory distress: Secondary | ICD-10-CM | POA: Diagnosis present

## 2024-06-21 DIAGNOSIS — J353 Hypertrophy of tonsils with hypertrophy of adenoids: Secondary | ICD-10-CM | POA: Diagnosis present

## 2024-06-21 DIAGNOSIS — J988 Other specified respiratory disorders: Secondary | ICD-10-CM | POA: Diagnosis present

## 2024-06-21 DIAGNOSIS — R590 Localized enlarged lymph nodes: Secondary | ICD-10-CM | POA: Diagnosis present

## 2024-06-21 DIAGNOSIS — B2799 Infectious mononucleosis, unspecified with other complication: Secondary | ICD-10-CM | POA: Diagnosis not present

## 2024-06-21 DIAGNOSIS — D72829 Elevated white blood cell count, unspecified: Secondary | ICD-10-CM | POA: Diagnosis present

## 2024-06-21 DIAGNOSIS — Z79899 Other long term (current) drug therapy: Secondary | ICD-10-CM | POA: Diagnosis not present

## 2024-06-21 DIAGNOSIS — T380X5A Adverse effect of glucocorticoids and synthetic analogues, initial encounter: Secondary | ICD-10-CM | POA: Diagnosis present

## 2024-06-21 DIAGNOSIS — Z5320 Procedure and treatment not carried out because of patient's decision for unspecified reasons: Secondary | ICD-10-CM | POA: Diagnosis present

## 2024-06-21 DIAGNOSIS — Z793 Long term (current) use of hormonal contraceptives: Secondary | ICD-10-CM | POA: Diagnosis not present

## 2024-06-21 DIAGNOSIS — R7401 Elevation of levels of liver transaminase levels: Secondary | ICD-10-CM | POA: Diagnosis present

## 2024-06-21 DIAGNOSIS — E861 Hypovolemia: Secondary | ICD-10-CM | POA: Diagnosis present

## 2024-06-21 DIAGNOSIS — J039 Acute tonsillitis, unspecified: Secondary | ICD-10-CM | POA: Diagnosis present

## 2024-06-21 LAB — CBC WITH DIFFERENTIAL/PLATELET
Abs Immature Granulocytes: 0.28 K/uL — ABNORMAL HIGH (ref 0.00–0.07)
Basophils Absolute: 0.1 K/uL (ref 0.0–0.1)
Basophils Relative: 0 %
Eosinophils Absolute: 0 K/uL (ref 0.0–0.5)
Eosinophils Relative: 0 %
HCT: 36.7 % (ref 36.0–46.0)
Hemoglobin: 11.8 g/dL — ABNORMAL LOW (ref 12.0–15.0)
Immature Granulocytes: 1 %
Lymphocytes Relative: 54 %
Lymphs Abs: 11.1 K/uL — ABNORMAL HIGH (ref 0.7–4.0)
MCH: 28.9 pg (ref 26.0–34.0)
MCHC: 32.2 g/dL (ref 30.0–36.0)
MCV: 90 fL (ref 80.0–100.0)
Monocytes Absolute: 2 K/uL — ABNORMAL HIGH (ref 0.1–1.0)
Monocytes Relative: 10 %
Neutro Abs: 7.1 K/uL (ref 1.7–7.7)
Neutrophils Relative %: 35 %
Platelets: 186 K/uL (ref 150–400)
RBC: 4.08 MIL/uL (ref 3.87–5.11)
RDW: 12.6 % (ref 11.5–15.5)
Smear Review: NORMAL
WBC: 20.6 K/uL — ABNORMAL HIGH (ref 4.0–10.5)
nRBC: 0 % (ref 0.0–0.2)

## 2024-06-21 LAB — COMPREHENSIVE METABOLIC PANEL WITH GFR
ALT: 120 U/L — ABNORMAL HIGH (ref 0–44)
AST: 146 U/L — ABNORMAL HIGH (ref 15–41)
Albumin: 2.8 g/dL — ABNORMAL LOW (ref 3.5–5.0)
Alkaline Phosphatase: 92 U/L (ref 38–126)
Anion gap: 12 (ref 5–15)
BUN: 7 mg/dL (ref 6–20)
CO2: 23 mmol/L (ref 22–32)
Calcium: 8 mg/dL — ABNORMAL LOW (ref 8.9–10.3)
Chloride: 98 mmol/L (ref 98–111)
Creatinine, Ser: 0.69 mg/dL (ref 0.44–1.00)
GFR, Estimated: >60 mL/min (ref >60)
Glucose, Bld: 104 mg/dL — ABNORMAL HIGH (ref 70–99)
Potassium: 4.2 mmol/L (ref 3.5–5.1)
Sodium: 133 mmol/L — ABNORMAL LOW (ref 135–145)
Total Bilirubin: 1.1 mg/dL (ref 0.0–1.2)
Total Protein: 7.2 g/dL (ref 6.5–8.1)

## 2024-06-21 LAB — PHOSPHORUS: Phosphorus: 4 mg/dL (ref 2.5–4.6)

## 2024-06-21 LAB — LACTIC ACID, PLASMA
Lactic Acid, Venous: 1.4 mmol/L (ref 0.5–1.9)
Lactic Acid, Venous: 1.4 mmol/L (ref 0.5–1.9)

## 2024-06-21 LAB — MAGNESIUM: Magnesium: 2.1 mg/dL (ref 1.7–2.4)

## 2024-06-21 LAB — PATHOLOGIST SMEAR REVIEW

## 2024-06-21 LAB — HIV ANTIBODY (ROUTINE TESTING W REFLEX): HIV Screen 4th Generation wRfx: NONREACTIVE

## 2024-06-21 MED ORDER — DEXAMETHASONE SOD PHOSPHATE PF 10 MG/ML IJ SOLN
10.0000 mg | Freq: Three times a day (TID) | INTRAMUSCULAR | Status: AC
  Administered 2024-06-21 (×3): 10 mg via INTRAVENOUS

## 2024-06-21 MED ORDER — ACETAMINOPHEN 500 MG PO TABS: 500.0000 mg | ORAL_TABLET | Freq: Four times a day (QID) | ORAL | Status: DC | PRN

## 2024-06-21 MED ORDER — SODIUM CHLORIDE 0.9 % IV SOLN
3.0000 g | Freq: Four times a day (QID) | INTRAVENOUS | Status: DC
  Administered 2024-06-21 – 2024-06-24 (×15): 3 g via INTRAVENOUS
  Filled 2024-06-21 (×15): qty 8

## 2024-06-21 MED ORDER — MORPHINE SULFATE (PF) 2 MG/ML IV SOLN: 2.0000 mg | INTRAVENOUS | Status: DC | PRN

## 2024-06-21 MED ORDER — MELATONIN 5 MG PO TABS
5.0000 mg | ORAL_TABLET | Freq: Every evening | ORAL | Status: DC | PRN
Start: 2024-06-21 — End: 2024-06-25
  Administered 2024-06-22: 5 mg via ORAL
  Filled 2024-06-21: qty 1

## 2024-06-21 MED ORDER — OXYCODONE HCL 5 MG PO TABS: 5.0000 mg | ORAL_TABLET | Freq: Four times a day (QID) | ORAL | Status: DC | PRN

## 2024-06-21 MED ORDER — LORAZEPAM 2 MG/ML IJ SOLN
0.5000 mg | Freq: Once | INTRAMUSCULAR | Status: AC
  Administered 2024-06-21: 0.5 mg via INTRAVENOUS
  Filled 2024-06-21: qty 1

## 2024-06-21 MED ORDER — SODIUM CHLORIDE 0.9 % IV SOLN: INTRAVENOUS | Status: DC

## 2024-06-21 MED ORDER — POLYETHYLENE GLYCOL 3350 17 G PO PACK: 17.0000 g | PACK | Freq: Every day | ORAL | Status: DC | PRN

## 2024-06-21 MED ORDER — PROCHLORPERAZINE EDISYLATE 10 MG/2ML IJ SOLN: 5.0000 mg | Freq: Four times a day (QID) | INTRAMUSCULAR | Status: DC | PRN

## 2024-06-21 MED ORDER — RACEPINEPHRINE HCL 2.25 % IN NEBU
0.5000 mL | INHALATION_SOLUTION | RESPIRATORY_TRACT | Status: DC | PRN
  Filled 2024-06-21: qty 15

## 2024-06-21 NOTE — Progress Notes (Signed)
   06/21/24 0003  BiPAP/CPAP/SIPAP  $ Non-Invasive Ventilator  Non-Invasive Vent Initial  $ Face Mask Small Yes  BiPAP/CPAP/SIPAP Pt Type Adult  BiPAP/CPAP/SIPAP SERVO  Mask Type Full face mask  Dentures removed? Not applicable  Mask Size Small  Respiratory Rate 24 breaths/min  IPAP 10 cmH20  EPAP 5 cmH2O  Pressure Support 5 cmH20  PEEP 5 cmH20  FiO2 (%) 30 %  Minute Ventilation 12.3  Leak 23  Peak Inspiratory Pressure (PIP) 15  Tidal Volume (Vt) 401  Patient Home Machine No  Patient Home Mask No  Patient Home Tubing No  Auto Titrate No  Press High Alarm 25 cmH2O  CPAP/SIPAP surface wiped down Yes  Device Plugged into RED Power Outlet Yes

## 2024-06-21 NOTE — H&P (Signed)
 History and Physical  Renee [name] FMW:[id number] DOB: 09-27-2005 DOA: 06/20/2024  Referring physician: Dr. Bari, EDP  PCP: [name], PA-C  Outpatient Specialists: None. Patient coming from: Home.  Chief Complaint: Throat swelling for 1 week.  HPI: Renee [name] is a 18 y.o. female with no significant past medical history who presents initially at [facility] ER with complaints of throat swelling x 1 week.  Associated with mild subjective fevers at home.  Seen in the ER on 06/18/2024 for the same, was diagnosed with mononucleosis syndrome.  She was treated in the ER, was able to tolerate p.o. challenge at that time and was discharged home with oral steroids and antibiotics.  Endorses worsening symptoms.  She has been unable to tolerate oral intake for the past 2 days due to swelling in the back of her throat.  Endorses shortness of breath.  No chest pain.  In the ER, the patient is accompanied by her mother who states that the patient has had difficulty eating and drinking.  EDP discussed the case with the ENT specialist who recommended racemic epi in addition to IV steroids and Unasyn which she received in the ER.  Also recommended transfer to [facility].  The patient was sent ED to ED.  Seen by ENT at [facility] ER.  He observed severe adenotonsillar hypertrophy secondary to active mononucleosis.  The patient had a CT soft tissue neck which did not show any airway obstruction at the level of the larynx or trachea.  ENT Dr. Marrion recommended continuing high-dose steroids for at least 24 hours and to initiate BiPAP as she likely has acute onset severe sleep apnea due to the adenotonsillar hypertrophy.  Placed on BiPAP in the ED.  EDP discussed the case with critical care medicine, recommended admission to stepdown unit.  Admitted by [facility], hospitalist service.   ED Course: Temperature 99.2.  BP 1 3780, pulse 81, respiration rate 25, O2 saturation 100% on  BiPAP.  Review of Systems: Review of systems as noted in the HPI. All other systems reviewed and are negative.  Past medical history: None reported.  Past surgical history: None reported.   Social History:  reports that she has never smoked. She does not have any smokeless tobacco history on file. She reports that she does not drink alcohol and does not use drugs.   No Known Allergies  Family history: None reported.  Prior to Admission medications   Medication Sig Start Date End Date Taking? Authorizing Provider  Acetaminophen (TYLENOL PO) Take 1 tablet by mouth 2 (two) times daily as needed (back pain).    [provider]  dexamethasone (DECADRON) 4 MG tablet Take 1 tablet (4 mg total) by mouth 2 (two) times daily with a meal for 5 days. 06/18/24 06/23/24  Rogelia Jerilynn RAMAN, MD  ibuprofen  (CHILDRENS MOTRIN ) 50 MG chewable tablet Chew 2 tablets (100 mg total) by mouth every 8 (eight) hours as needed for pain. 06/01/14   Nivia Colon, PA-C  metroNIDAZOLE (FLAGYL) 500 MG tablet Take 1 tablet (500 mg total) by mouth 2 (two) times daily. 06/18/24   Rogelia Jerilynn RAMAN, MD  Norethindrone-Ethinyl Estradiol-Fe Biphas (LO LOESTRIN FE) 1 MG-10 MCG / 10 MCG tablet Take 1 tablet by mouth daily. 03/24/22   [provider]  ondansetron  (ZOFRAN ) 4 MG tablet Take 1 tablet (4 mg total) by mouth every 8 (eight) hours as needed for nausea or vomiting. 06/18/24   Rogelia Jerilynn RAMAN, MD  ondansetron  (ZOFRAN -ODT) 4  MG disintegrating tablet Take 1 tablet (4 mg total) by mouth every 8 (eight) hours as needed for nausea or vomiting. 06/02/23   Hazen Darryle BRAVO, FNP    Physical Exam: BP 137/80   Pulse 81   Temp 99.4 F (37.4 C) (Oral)   Resp 19   LMP 05/22/2024   SpO2 100%   General: 18 y.o. year-old female well developed well nourished in no acute distress.  Alert and oriented x3. Cardiovascular: Regular rate and rhythm with no rubs or gallops.  No thyromegaly or JVD noted.  No lower  extremity edema. 2/4 pulses in all 4 extremities. Respiratory: Clear to auscultation with no wheezes or rales. Good inspiratory effort.  Diffuse bilateral cervical adenopathy. Abdomen: Soft nontender nondistended with normal bowel sounds x4 quadrants. Muskuloskeletal: No cyanosis, clubbing or edema noted bilaterally Neuro: CN II-XII intact, strength, sensation, reflexes Skin: No ulcerative lesions noted or rashes Psychiatry: Judgement and insight appear normal. Mood is appropriate for condition and setting          Labs on Admission:  Basic Metabolic Panel: Recent Labs  Lab 06/18/24 1122 06/18/24 1408 06/20/24 2105  NA 139 138 133*  K 3.6 3.6 4.6  CL 99 103 97*  CO2 23 22 24   GLUCOSE 91 116* 93  BUN 9 7 7   CREATININE 0.58 0.52 0.61  CALCIUM 9.4 8.9 8.9   Liver Function Tests: Recent Labs  Lab 06/18/24 1122  AST 67*  ALT 72*  ALKPHOS 98  BILITOT 0.5  PROT 9.0*  ALBUMIN 4.4   Recent Labs  Lab 06/18/24 1122  LIPASE 21   No results for input(s): AMMONIA in the last 168 hours. CBC: Recent Labs  Lab 06/18/24 1122 06/20/24 2105  WBC 17.7* 27.2*  NEUTROABS 6.7 16.3*  HGB 14.0 13.4  HCT 41.8 42.5  MCV 86.7 89.1  PLT 205 245   Cardiac Enzymes: No results for input(s): CKTOTAL, CKMB, CKMBINDEX, TROPONINI in the last 168 hours.  BNP (last 3 results) No results for input(s): BNP in the last 8760 hours.  ProBNP (last 3 results) No results for input(s): PROBNP in the last 8760 hours.  CBG: No results for input(s): GLUCAP in the last 168 hours.  Radiological Exams on Admission: CT Soft Tissue Neck W Contrast Result Date: 06/20/2024 EXAM: CT NECK WITH CONTRAST 06/20/2024 10:33:01 PM TECHNIQUE: CT of the neck was performed with the administration of intravenous contrast. Multiplanar reformatted images are provided for review. Automated exposure control, iterative reconstruction, and/or weight based adjustment of the mA/kV was utilized to reduce the  radiation dose to as low as reasonably achievable. COMPARISON: None available. CLINICAL HISTORY: Soft tissue infection suspected, neck, xray done FINDINGS: AERODIGESTIVE TRACT: Enlarged tonsils with striated enhancement. No discrete, drainable fluid collection. SALIVARY GLANDS: The parotid and submandibular glands are unremarkable. THYROID: Unremarkable. LYMPH NODES: Marked lymphadenopathy throughout the neck bilaterally. SOFT TISSUES: No mass or fluid collection. BRAIN, ORBITS, SINUSES AND MASTOIDS: No acute abnormality. LUNGS AND MEDIASTINUM: No acute abnormality. BONES: No focal bone abnormality. IMPRESSION: 1. Enlarged tonsils with striated enhancement, compatible with tonsillitis. No discrete, drainable fluid collection. 2. Marked lymphadenopathy throughout the neck bilaterally, nonspecific but potentially reactive given the above findings. Recommend close clinical follow-up to ensure resolution and to exclude a lymphoproliferative disorder. Electronically signed by: Gilmore Molt MD 06/20/2024 10:54 PM EST RP Workstation: [id number]   DG Chest 2 View Result Date: 06/20/2024 EXAM: 2 VIEW(S) XRAY OF THE CHEST 06/20/2024 09:23:00 PM COMPARISON: None available. CLINICAL HISTORY: 141880 SOB (shortness  of breath) 141880 SOB (shortness of breath) FINDINGS: LUNGS AND PLEURA: No focal pulmonary opacity. No pulmonary edema. No pleural effusion. No pneumothorax. HEART AND MEDIASTINUM: No acute abnormality of the cardiac and mediastinal silhouettes. BONES AND SOFT TISSUES: No acute osseous abnormality. IMPRESSION: 1. No acute process. Electronically signed by: Morgane Naveau MD 06/20/2024 09:26 PM EST RP Workstation: [id number]    EKG: I independently viewed the EKG done and my findings are as followed: None available at the time of this visit.  Assessment/Plan Present on Admission:  Infectious mononucleosis, with other complication, infectious mononucleosis due to unspecified organism  Principal Problem:    Infectious mononucleosis, with other complication, infectious mononucleosis due to unspecified organism  Sepsis secondary to infectious mononucleosis with severe adenotonsillar hypertrophy, concern for upper airway obstruction, POA Leukocytosis 27.2 K, tachypnea RR 25, infectious mononucleosis Follow lactic acid level and peripheral blood cultures x 2. Judicious IV fluid to prevent volume overload and respiratory compromise. Continue IV Decadron 10 mg every 8 hours x 3 doses Continue IV Unasyn 3 g every 6 hours Continue BiPAP nightly as recommended by ENT Continue to closely monitor symptomatology, in stepdown unit. Appreciate ENTs assistance.  Acute respiratory distress in the setting of infectious mononucleosis with severe adenotonsillar hypertrophy Maintain O2 saturation above 92% Nasal cannula during the day BiPAP nightly as tolerated, wean off when able.  Hypovolemic hyponatremia Serum sodium 133 NS at 75 cc/h x 2 days. Repeat BMP in the morning   Critical care time: 55 minutes.   DVT prophylaxis: SCDs.  Code Status: Full code.  Family Communication: Mother at bedside.  Disposition Plan: Admitted to progressive care unit.  Consults called: ENT.  Admission status: Inpatient status.   Status is: Inpatient The patient requires at least 2 midnights for further evaluation and treatment of present condition.   Terry LOISE Hurst MD [facility] Pager [phone]  If 7PM-7AM, please contact night-coverage [email or web address] Password [email or web address]  06/21/2024, 1:57 AM

## 2024-06-21 NOTE — ED Provider Notes (Signed)
 Patient arrived in transfer from [facility].  ENT evaluated patient.  Felt that she may need BiPAP and may develop some apnea through the evening but does not feel that he she needs emergent airway control.  Agrees with management otherwise.  Will consult with critical care given potential for decompensation. Physical Exam  BP 137/80   Pulse 81   Temp 99.4 F (37.4 C) (Oral)   Resp 19   LMP 05/22/2024   SpO2 100%   Physical Exam Awake and alert Mouth breathing, mild tachypnea in the 30s Posterior oropharynx with significantly edematous tonsils bilaterally with tonsillar exudate  Procedures  Procedures  ED Course / MDM   Clinical Course as of 06/21/24 0101  Tue Jun 21, 2024  0100 Spoke to Dr. Layman who feels she can go to the stepdown unit. [CH]    Clinical Course User Index [CH] Collin Hendley, Charmaine FALCON, MD   Medical Decision Making Amount and/or Complexity of Data Reviewed Labs: ordered. Radiology: ordered.  Risk OTC drugs. Prescription drug management.   Problem List Items Addressed This Visit   None Visit Diagnoses       Infectious mononucleosis, with other complication, infectious mononucleosis due to unspecified organism    -  Primary     Airway compromise                 Bari Charmaine FALCON, MD 06/21/24 0101

## 2024-06-21 NOTE — ED Notes (Signed)
 Pt reports she was able to tolerate a little jello, but she felt like it was getting stuck.  Pt continues to eat ice chips.

## 2024-06-21 NOTE — Progress Notes (Signed)
 Patient seen and examined.  Admitted early morning hours by nighttime hospitalist.  See H&P assessment plan. On my exam, patient was desiring to start some ice chips and maybe some ice water.  Still has feeling of full throat maybe slightly better than yesterday.  Mother at the bedside.  Remains afebrile overnight.  WC count trending down.  In brief, 18 year old with severe infectious mononucleosis and tonsillar swelling, massive cervical lymphadenopathy admitted with concern for airway compromise but currently maintaining on room air.  Some clinical improvement today.  Seen by ENT and suggested conservative management. -Continue Unasyn today.  Continue IV fluids.  Continue dexamethasone. -Allow clear liquid diet. Need to stay in the hospital due to significant swelling. Currently does not need BiPAP, patient also could not tolerate it.  She is on room air.   Same-day admit.  No charge visit.

## 2024-06-21 NOTE — ED Notes (Signed)
 Patient unable to tolerate BIPAP despite ativan administration. Patient resting comfortably on room air with no apneic periods and SPO2 remains above 97%. MD notified and acknowledged.

## 2024-06-22 DIAGNOSIS — B2799 Infectious mononucleosis, unspecified with other complication: Secondary | ICD-10-CM | POA: Diagnosis not present

## 2024-06-22 DIAGNOSIS — J988 Other specified respiratory disorders: Secondary | ICD-10-CM

## 2024-06-22 LAB — COMPREHENSIVE METABOLIC PANEL WITH GFR
ALT: 128 U/L — ABNORMAL HIGH (ref 0–44)
AST: 156 U/L — ABNORMAL HIGH (ref 15–41)
Albumin: 2.8 g/dL — ABNORMAL LOW (ref 3.5–5.0)
Alkaline Phosphatase: 83 U/L (ref 38–126)
Anion gap: 12 (ref 5–15)
BUN: 7 mg/dL (ref 6–20)
CO2: 23 mmol/L (ref 22–32)
Calcium: 7.5 mg/dL — ABNORMAL LOW (ref 8.9–10.3)
Chloride: 100 mmol/L (ref 98–111)
Creatinine, Ser: 0.63 mg/dL (ref 0.44–1.00)
GFR, Estimated: >60 mL/min (ref >60)
Glucose, Bld: 111 mg/dL — ABNORMAL HIGH (ref 70–99)
Potassium: 3.7 mmol/L (ref 3.5–5.1)
Sodium: 135 mmol/L (ref 135–145)
Total Bilirubin: 0.7 mg/dL (ref 0.0–1.2)
Total Protein: 7.3 g/dL (ref 6.5–8.1)

## 2024-06-22 LAB — CBC WITH DIFFERENTIAL/PLATELET
Abs Immature Granulocytes: 0.26 K/uL — ABNORMAL HIGH (ref 0.00–0.07)
Basophils Absolute: 0.2 K/uL — ABNORMAL HIGH (ref 0.0–0.1)
Basophils Relative: 1 %
Eosinophils Absolute: 0 K/uL (ref 0.0–0.5)
Eosinophils Relative: 0 %
HCT: 36.6 % (ref 36.0–46.0)
Hemoglobin: 11.9 g/dL — ABNORMAL LOW (ref 12.0–15.0)
Immature Granulocytes: 2 %
Lymphocytes Relative: 40 %
Lymphs Abs: 7 K/uL — ABNORMAL HIGH (ref 0.7–4.0)
MCH: 28.7 pg (ref 26.0–34.0)
MCHC: 32.5 g/dL (ref 30.0–36.0)
MCV: 88.2 fL (ref 80.0–100.0)
Monocytes Absolute: 2.3 K/uL — ABNORMAL HIGH (ref 0.1–1.0)
Monocytes Relative: 13 %
Neutro Abs: 8 K/uL — ABNORMAL HIGH (ref 1.7–7.7)
Neutrophils Relative %: 44 %
Platelets: 252 K/uL (ref 150–400)
RBC: 4.15 MIL/uL (ref 3.87–5.11)
RDW: 12.7 % (ref 11.5–15.5)
Smear Review: NORMAL
WBC: 17.7 K/uL — ABNORMAL HIGH (ref 4.0–10.5)
nRBC: 0 % (ref 0.0–0.2)

## 2024-06-22 LAB — MAGNESIUM: Magnesium: 2 mg/dL (ref 1.7–2.4)

## 2024-06-22 MED ORDER — DEXAMETHASONE SODIUM PHOSPHATE 4 MG/ML IJ SOLN
4.0000 mg | Freq: Three times a day (TID) | INTRAMUSCULAR | Status: AC
  Administered 2024-06-22 – 2024-06-23 (×3): 4 mg via INTRAVENOUS
  Filled 2024-06-22 (×3): qty 1

## 2024-06-22 MED ORDER — ENOXAPARIN SODIUM 40 MG/0.4ML IJ SOSY
40.0000 mg | PREFILLED_SYRINGE | INTRAMUSCULAR | Status: DC
  Administered 2024-06-22 – 2024-06-24 (×3): 40 mg via SUBCUTANEOUS
  Filled 2024-06-22 (×3): qty 0.4

## 2024-06-22 NOTE — Plan of Care (Signed)
   Problem: Education: Goal: Knowledge of General Education information will improve Description Including pain rating scale, medication(s)/side effects and non-pharmacologic comfort measures Outcome: Progressing   Problem: Health Behavior/Discharge Planning: Goal: Ability to manage health-related needs will improve Outcome: Progressing

## 2024-06-22 NOTE — Progress Notes (Signed)
 Reason for Consult: Airway obstruction   Referring Physician: Emergency department   HPI: Renee [name] is an 18 y.o. female with severe mononucleosis and upper airway obstruction.  Since admission she reports that she is very slightly improved but is still having difficulty breathing particularly at night and is not tolerating solid diet.  He was previously given positive airway pressure and reports that she felt that this made the breathing worse.     PHYSICAL EXAM:   CONSTITUTIONAL: Somewhat uncomfortable alert and oriented x 3 EYES: PERRL, EOMI  Nose: nose normal and no purulence, complete nasal obstruction without nasal airflow. Mouth/Throat: 4+ tonsils NECK: Diffuse bilateral cervical adenopathy   Studies Reviewed: CT images were independently reviewed and showed marked adenotonsillar hypertrophy with no airway obstruction at the level of the larynx or trachea.   Assessment/Plan: Upper airway obstruction - She has continued severe adenotonsillar hypertrophy.  We discussed consideration of a full liquid diet.  She did have some difficulty tolerating PAP and we discussed that this would be required if she has significant oxygen desaturations when sleeping as she likely has acute onset sleep apnea.  We also discussed that likely BiPAP would be required as she likely would need high pressures on her CPAP limiting expiration. We discussed the role of tonsillectomy and adenoidectomy only for persistent sleep apnea and adenotonsillar hypertrophy for extended period time.   Mononucleosis - We discussed the nature of the disease and supportive care.   Zach Sotiria Keast MD

## 2024-06-22 NOTE — Progress Notes (Signed)
 PROGRESS NOTE        PATIENT DETAILS Name: Renee [name] Age: 18 y.o. Sex: female Date of Birth: 09-Jul-2006 Admit Date: 06/20/2024 Admitting Physician Terry LOISE Hurst, DO ERE:[email or web address], Ronal Crank, PA-C  Brief Summary: Patient is a 18 y.o.  female who presented with sore throat/difficulty swallowing x 1 week-prior outpatient workup suggestive of infectious mononucleosis-subsequently found to have severe enlargement of tonsils with upper airway obstruction-ENT consulted and subsequently admitted to the hospitalist service.  Significant events: 11/4>> admit to [facility]  Significant studies: 11/3>> CT neck: Enlarged tonsils with striated enhancement-consistent with tonsillitis.  Marked lymphadenopathy throughout neck bilaterally.  Significant microbiology data: 10/30>> rapid strep A: Negative (results in Care Everywhere) 10/30>> infectious mononucleosis antibody: Positive (results in Care Everywhere) 11/01>> COVID/influenza/RSV PCR: Negative 11 3>> blood culture: No growth 11/4>> blood culture: No growth 11/04>> HIV: Nonreactive  Procedures: None  Consults: ENT  Subjective: Lying comfortably in bed-denies any chest pain or shortness of breath.  Voice is still somewhat hoarse-tolerating clear liquids-she feels somewhat better than how she first presented.  Objective: Vitals: Blood pressure (!) 123/91, pulse 69, temperature 98.6 F (37 C), temperature source Oral, resp. rate 18, last menstrual period 05/22/2024, SpO2 100%.   Exam: Gen Exam:Alert awake-not in any distress HEENT:atraumatic, normocephalic Chest: B/L clear to auscultation anteriorly CVS:S1S2 regular Abdomen:soft non tender, non distended Extremities:no edema Neurology: Non focal Skin: no rash  Pertinent Labs/Radiology:    Latest Ref Rng & Units 06/22/2024    2:35 AM 06/21/2024    5:07 AM 06/20/2024    9:05 PM  CBC  WBC 4.0 - 10.5 K/uL 17.7  20.6  27.2   Hemoglobin 12.0 - 15.0 g/dL  88.0  88.1  86.5   Hematocrit 36.0 - 46.0 % 36.6  36.7  42.5   Platelets 150 - 400 K/uL 252  186  245     Lab Results  Component Value Date   NA 135 06/22/2024   K 3.7 06/22/2024   CL 100 06/22/2024   CO2 23 06/22/2024      Assessment/Plan: 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  Code status:   Code Status: Full Code   DVT Prophylaxis: SCDs Start: 06/21/24 0137   Family Communication: Mother at bedside   Disposition Plan: Status is: Inpatient Remains inpatient appropriate because: Severity of infection   Planned Discharge Destination:Home   Diet: Diet Order             Diet full liquid Room service appropriate? Yes; Fluid consistency: Thin  Diet effective now                     Antimicrobial agents: Anti-infectives (From admission, onward)    Start     Dose/Rate Route Frequency Ordered Stop   06/21/24 0400  Ampicillin-Sulbactam (UNASYN) 3 g in sodium chloride 0.9 % 100 mL IVPB        3 g 200 mL/hr over 30 Minutes Intravenous Every 6 hours 06/21/24 0201     06/20/24 2115  Ampicillin-Sulbactam (  UNASYN) 3 g in sodium chloride 0.9 % 100 mL IVPB        3 g 200 mL/hr over 30 Minutes Intravenous  Once 06/20/24 2108 06/20/24 2220        MEDICATIONS: Scheduled Meds: Continuous Infusions:  sodium chloride 75 mL/hr at 06/22/24 0827   ampicillin-sulbactam (UNASYN) IV 3 g (06/22/24 0942)   PRN Meds:.acetaminophen, melatonin, morphine injection, oxyCODONE, polyethylene  glycol, prochlorperazine, Racepinephrine HCl   I have personally reviewed following labs and imaging studies  LABORATORY DATA: CBC: Recent Labs  Lab 06/18/24 1122 06/20/24 2105 06/21/24 0507 06/22/24 0235  WBC 17.7* 27.2* 20.6* 17.7*  NEUTROABS 6.7 16.3* 7.1 8.0*  HGB 14.0 13.4 11.8* 11.9*  HCT 41.8 42.5 36.7 36.6  MCV 86.7 89.1 90.0 88.2  PLT 205 245 186 252    Basic Metabolic Panel: Recent Labs  Lab 06/18/24 1122 06/18/24 1408 06/20/24 2105 06/21/24 0507 06/22/24 0235  NA 139 138 133* 133* 135  K 3.6 3.6 4.6 4.2 3.7  CL 99 103 97* 98 100  CO2 23 22 24 23 23   GLUCOSE 91 116* 93 104* 111*  BUN 9 7 7 7 7   CREATININE 0.58 0.52 0.61 0.69 0.63  CALCIUM 9.4 8.9 8.9 8.0* 7.5*  MG  --   --   --  2.1 2.0  PHOS  --   --   --  4.0  --     GFR: CrCl cannot be calculated (Unknown ideal weight.).  Liver Function Tests: Recent Labs  Lab 06/18/24 1122 06/21/24 0507 06/22/24 0235  AST 67* 146* 156*  ALT 72* 120* 128*  ALKPHOS 98 92 83  BILITOT 0.5 1.1 0.7  PROT 9.0* 7.2 7.3  ALBUMIN 4.4 2.8* 2.8*   Recent Labs  Lab 06/18/24 1122  LIPASE 21   No results for input(s): AMMONIA in the last 168 hours.  Coagulation Profile: No results for input(s): INR, PROTIME in the last 168 hours.  Cardiac Enzymes: No results for input(s): CKTOTAL, CKMB, CKMBINDEX, TROPONINI in the last 168 hours.  BNP (last 3 results) No results for input(s): PROBNP in the last 8760 hours.  Lipid Profile: No results for input(s): CHOL, HDL, LDLCALC, TRIG, CHOLHDL, LDLDIRECT in the last 72 hours.  Thyroid Function Tests: No results for input(s): TSH, T4TOTAL, FREET4, T3FREE, THYROIDAB in the last 72 hours.  Anemia Panel: No results for input(s): VITAMINB12, FOLATE, FERRITIN, TIBC, IRON, RETICCTPCT in the last 72 hours.  Urine analysis:    Component Value Date/Time   COLORURINE YELLOW 06/01/2014 1411   APPEARANCEUR CLEAR 06/01/2014  1411   LABSPEC 1.024 06/01/2014 1411   PHURINE 6.5 06/01/2014 1411   GLUCOSEU NEGATIVE 06/01/2014 1411   HGBUR NEGATIVE 06/01/2014 1411   BILIRUBINUR NEGATIVE 06/01/2014 1411   KETONESUR NEGATIVE 06/01/2014 1411   PROTEINUR NEGATIVE 06/01/2014 1411   UROBILINOGEN 0.2 06/01/2014 1411   NITRITE NEGATIVE 06/01/2014 1411   LEUKOCYTESUR SMALL (A) 06/01/2014 1411    Sepsis Labs: Lactic Acid, Venous    Component Value Date/Time   LATICACIDVEN 1.4 06/21/2024 0507    MICROBIOLOGY: Recent Results (from the past 240 hours)  Group A Strep by PCR if patient complains of sore throat.     Status: None   Collection Time: 06/18/24 10:32 AM   Specimen: Throat; Sterile Swab  Result Value Ref Range Status   Group A Strep by PCR NOT DETECTED NOT DETECTED Final    Comment: Performed at [facility], [address], [place], [place] [address]  Resp panel by RT-PCR (RSV, Flu A&B, Covid) Anterior Nasal Swab     Status: None   Collection Time: 06/18/24 10:32 AM   Specimen: Anterior Nasal Swab  Result Value Ref Range Status   SARS Coronavirus 2 by RT PCR NEGATIVE NEGATIVE Final    Comment: (NOTE) SARS-CoV-2 target nucleic acids are NOT DETECTED.  The SARS-CoV-2 RNA is generally detectable in upper respiratory specimens during the acute phase of infection. The lowest concentration of SARS-CoV-2 viral copies this assay can detect is 138 copies/mL. A negative result does not preclude SARS-Cov-2 infection and should not be used as the sole basis for treatment or other patient management decisions. A negative result may occur with  improper specimen collection/handling, submission of specimen other than nasopharyngeal swab, presence of viral mutation(s) within the areas targeted by this assay, and inadequate number of viral copies(<138 copies/mL). A negative result must be combined with clinical observations, patient history, and epidemiological information. The expected result is  Negative.  Fact Sheet for Patients:  bloggercourse.com  Fact Sheet for Healthcare Providers:  seriousbroker.it  This test is no t yet approved or cleared by the [place]  FDA and  has been authorized for detection and/or diagnosis of SARS-CoV-2 by FDA under an Emergency Use Authorization (EUA). This EUA will remain  in effect (meaning this test can be used) for the duration of the COVID-19 declaration under Section 564(b)(1) of the Act, 21 U.S.C.section 360bbb-3(b)(1), unless the authorization is terminated  or revoked sooner.       Influenza A by PCR NEGATIVE NEGATIVE Final   Influenza B by PCR NEGATIVE NEGATIVE Final    Comment: (NOTE) The Xpert Xpress SARS-CoV-2/FLU/RSV plus assay is intended as an aid in the diagnosis of influenza from Nasopharyngeal swab specimens and should not be used as a sole basis for treatment. Nasal washings and aspirates are unacceptable for Xpert Xpress SARS-CoV-2/FLU/RSV testing.  Fact Sheet for Patients: bloggercourse.com  Fact Sheet for Healthcare Providers: seriousbroker.it  This test is not yet approved or cleared by the [place]  FDA and has been authorized for detection and/or diagnosis of SARS-CoV-2 by FDA under an Emergency Use Authorization (EUA). This EUA will remain in effect (meaning this test can be used) for the duration of the COVID-19 declaration under Section 564(b)(1) of the Act, 21 U.S.C. section 360bbb-3(b)(1), unless the authorization is terminated or revoked.     Resp Syncytial Virus by PCR NEGATIVE NEGATIVE Final    Comment: (NOTE) Fact Sheet for Patients: bloggercourse.com  Fact Sheet for Healthcare Providers: seriousbroker.it  This test is not yet approved or cleared by the [place]  FDA and has been authorized for detection and/or diagnosis of  SARS-CoV-2 by FDA under an Emergency Use Authorization (EUA). This EUA will remain in effect (meaning this test can be used) for the duration of the COVID-19 declaration under Section 564(b)(1) of the Act, 21 U.S.C. section 360bbb-3(b)(1), unless the authorization is terminated or revoked.  Performed at [facility], [address] [place], [place] [address]   Blood culture (routine x 2)     Status: None (Preliminary result)   Collection Time: 06/20/24 11:13 PM   Specimen: BLOOD  Result Value Ref Range Status   Specimen Description BLOOD SITE NOT SPECIFIED  Final   Special Requests   Final    BOTTLES DRAWN AEROBIC AND ANAEROBIC Blood Culture adequate volume   Culture   Final    NO GROWTH 2 DAYS Performed at [facility], [address] [place], [place] [address]    Report Status PENDING  Incomplete  Blood culture (routine x 2)     Status: None (Preliminary result)   Collection Time: 06/21/24 12:38 PM   Specimen: BLOOD LEFT HAND  Result Value Ref Range Status   Specimen Description BLOOD LEFT HAND  Final   Special Requests   Final    BOTTLES DRAWN AEROBIC ONLY Blood Culture results may not be optimal due to an inadequate volume of blood received in culture bottles   Culture   Final    NO GROWTH < 24 HOURS Performed at [facility], [address] [place], [place] [address]    Report Status PENDING  Incomplete    RADIOLOGY STUDIES/RESULTS: CT Soft Tissue Neck W Contrast Result Date: 06/20/2024 EXAM: CT NECK WITH CONTRAST 06/20/2024 10:33:01 PM TECHNIQUE: CT of the neck was performed with the administration of intravenous contrast. Multiplanar reformatted images are provided for review. Automated exposure control, iterative reconstruction, and/or weight based adjustment of the mA/kV was utilized to reduce the radiation dose to as low as reasonably achievable. COMPARISON: None available. CLINICAL HISTORY: Soft tissue infection suspected, neck, xray done  FINDINGS: AERODIGESTIVE TRACT: Enlarged tonsils with striated enhancement. No discrete, drainable fluid collection. SALIVARY GLANDS: The parotid and submandibular glands are unremarkable. THYROID: Unremarkable. LYMPH NODES: Marked lymphadenopathy throughout the neck bilaterally. SOFT TISSUES: No mass or fluid collection. BRAIN, ORBITS, SINUSES AND MASTOIDS: No acute abnormality. LUNGS AND MEDIASTINUM: No acute abnormality. BONES: No focal bone abnormality. IMPRESSION: 1. Enlarged tonsils with striated enhancement, compatible with tonsillitis. No discrete, drainable fluid collection. 2. Marked lymphadenopathy throughout the neck bilaterally, nonspecific but potentially reactive given the above findings. Recommend close clinical follow-up to ensure resolution and to exclude a lymphoproliferative disorder. Electronically signed by: Gilmore Molt MD 06/20/2024 10:54 PM EST RP Workstation: [id number]   DG Chest 2 View Result Date: 06/20/2024 EXAM: 2 VIEW(S) XRAY OF THE CHEST 06/20/2024 09:23:00 PM COMPARISON: None available. CLINICAL HISTORY: 141880 SOB (shortness of breath) 141880 SOB (shortness of breath) FINDINGS: LUNGS AND PLEURA: No focal pulmonary opacity. No pulmonary edema. No pleural effusion. No pneumothorax. HEART AND MEDIASTINUM: No acute abnormality of the cardiac and mediastinal silhouettes. BONES AND SOFT TISSUES: No acute osseous abnormality. IMPRESSION: 1. No acute process. Electronically signed by: Morgane Naveau MD 06/20/2024 09:26 PM EST RP Workstation: [id number]     LOS: 1 day   Donalda Applebaum, MD  [facility]    To contact the attending provider between 7A-7P or the covering provider during after hours 7P-7A, please log into the web site [email or web address] and access using universal [place] password for that web site. If you do not have the password, please call the hospital operator.  06/22/2024, 2:14 PM

## 2024-06-22 NOTE — Plan of Care (Signed)
  Problem: Education: Goal: Knowledge of General Education information will improve Description: Including pain rating scale, medication(s)/side effects and non-pharmacologic comfort measures 06/22/2024 1152 by Lynnette Cena CROME, RN Outcome: Progressing 06/22/2024 1148 by Lynnette Cena CROME, RN Outcome: Progressing 06/22/2024 1147 by Lynnette Cena CROME, RN Outcome: Progressing   Problem: Health Behavior/Discharge Planning: Goal: Ability to manage health-related needs will improve 06/22/2024 1152 by Lynnette Cena CROME, RN Outcome: Progressing 06/22/2024 1148 by Lynnette Cena CROME, RN Outcome: Progressing 06/22/2024 1147 by Lynnette Cena CROME, RN Outcome: Progressing

## 2024-06-22 NOTE — Plan of Care (Signed)
  Problem: Education: Goal: Knowledge of General Education information will improve Description: Including pain rating scale, medication(s)/side effects and non-pharmacologic comfort measures 06/22/2024 1148 by Lynnette Cena CROME, RN Outcome: Progressing 06/22/2024 1147 by Lynnette Cena CROME, RN Outcome: Progressing   Problem: Health Behavior/Discharge Planning: Goal: Ability to manage health-related needs will improve 06/22/2024 1148 by Lynnette Cena CROME, RN Outcome: Progressing 06/22/2024 1147 by Lynnette Cena CROME, RN Outcome: Progressing

## 2024-06-22 NOTE — TOC Initial Note (Signed)
 Transition of Care [facility]) - Initial/Assessment Note    Patient Details  Name: Renee [name] MRN: [id number] Date of Birth: 10-18-2005  [facility] CM/SW Contact:    Marval Gell, RN Phone Number: 06/22/2024, 8:33 AM  Clinical Narrative:                  Per chart review Patient admitted with mono, severe throat pain and swelling. Currently RA Has medicaid coverage and primary care listed, added to AVS.  No ICM needs identified at this time   Expected Discharge Plan: Home/Self Care Barriers to Discharge: Continued Medical Work up   Patient Goals and CMS Choice            Expected Discharge Plan and Services                                              Prior Living Arrangements/Services                       Activities of Daily Living   ADL Screening (condition at time of admission) Independently performs ADLs?: Yes (appropriate for developmental age) Is the patient deaf or have difficulty hearing?: No Does the patient have difficulty seeing, even when wearing glasses/contacts?: Yes Does the patient have difficulty concentrating, remembering, or making decisions?: No  Permission Sought/Granted                  Emotional Assessment              Admission diagnosis:  Airway compromise [J98.8] Infectious mononucleosis, with other complication, infectious mononucleosis due to unspecified organism [B27.99] Patient Active Problem List   Diagnosis Date Noted   Infectious mononucleosis, with other complication, infectious mononucleosis due to unspecified organism 06/21/2024   PCP:  [name], PA-C Pharmacy:   [id number] - [place], [place] - [address] [place] [address] Phone: [phone] Fax: [phone]     Social Drivers of Health (SDOH) Social History: SDOH Screenings   Food Insecurity: No Food Insecurity (06/21/2024)  Housing: Low Risk  (06/21/2024)   Transportation Needs: No Transportation Needs (06/21/2024)  Utilities: Patient Declined (06/21/2024)  Financial Resource Strain: Not on File (03/24/2022)   [facility]  Physical Activity: Not on File (02/07/2022)   [facility]  Social Connections: Not on File (04/28/2023)   Received from [facility]  Stress: Not on File (02/07/2022)   Received from [facility]  Tobacco Use: Unknown (06/21/2024)   SDOH Interventions:     Readmission Risk Interventions     No data to display

## 2024-06-22 NOTE — Plan of Care (Signed)

## 2024-06-23 DIAGNOSIS — J988 Other specified respiratory disorders: Secondary | ICD-10-CM | POA: Diagnosis not present

## 2024-06-23 DIAGNOSIS — B2799 Infectious mononucleosis, unspecified with other complication: Secondary | ICD-10-CM | POA: Diagnosis not present

## 2024-06-23 LAB — CBC
HCT: 37.9 % (ref 36.0–46.0)
Hemoglobin: 12.4 g/dL (ref 12.0–15.0)
MCH: 28.4 pg (ref 26.0–34.0)
MCHC: 32.7 g/dL (ref 30.0–36.0)
MCV: 86.7 fL (ref 80.0–100.0)
Platelets: 253 K/uL (ref 150–400)
RBC: 4.37 MIL/uL (ref 3.87–5.11)
RDW: 12.6 % (ref 11.5–15.5)
WBC: 14 K/uL — ABNORMAL HIGH (ref 4.0–10.5)
nRBC: 0 % (ref 0.0–0.2)

## 2024-06-23 LAB — COMPREHENSIVE METABOLIC PANEL WITH GFR
ALT: 122 U/L — ABNORMAL HIGH (ref 0–44)
AST: 103 U/L — ABNORMAL HIGH (ref 15–41)
Albumin: 2.9 g/dL — ABNORMAL LOW (ref 3.5–5.0)
Alkaline Phosphatase: 77 U/L (ref 38–126)
Anion gap: 12 (ref 5–15)
BUN: 11 mg/dL (ref 6–20)
CO2: 25 mmol/L (ref 22–32)
Calcium: 8.3 mg/dL — ABNORMAL LOW (ref 8.9–10.3)
Chloride: 99 mmol/L (ref 98–111)
Creatinine, Ser: 0.63 mg/dL (ref 0.44–1.00)
GFR, Estimated: >60 mL/min (ref >60)
Glucose, Bld: 128 mg/dL — ABNORMAL HIGH (ref 70–99)
Potassium: 5 mmol/L (ref 3.5–5.1)
Sodium: 136 mmol/L (ref 135–145)
Total Bilirubin: 0.5 mg/dL (ref 0.0–1.2)
Total Protein: 7.8 g/dL (ref 6.5–8.1)

## 2024-06-23 NOTE — Progress Notes (Signed)
 PROGRESS NOTE        PATIENT DETAILS Name: Renee [name] Age: 18 y.o. Sex: female Date of Birth: 23-Sep-2005 Admit Date: 06/20/2024 Admitting Physician Terry LOISE Hurst, DO ERE:Jixpwd, Ronal Crank, PA-C  Brief Summary: Patient is a 18 y.o.  female who presented with sore throat/difficulty swallowing x 1 week-prior outpatient workup suggestive of infectious mononucleosis-subsequently found to have severe enlargement of tonsils with upper airway obstruction-ENT consulted and subsequently admitted to the hospitalist service.  Significant events: 11/4>> admit to [facility]  Significant studies: 11/3>> CT neck: Enlarged tonsils with striated enhancement-consistent with tonsillitis.  Marked lymphadenopathy throughout neck bilaterally.  Significant microbiology data: 10/30>> rapid strep A: Negative (results in Care Everywhere) 10/30>> infectious mononucleosis antibody: Positive (results in Care Everywhere) 11/01>> COVID/influenza/RSV PCR: Negative 11 3>> blood culture: No growth 11/4>> blood culture: No growth 11/04>> HIV: Nonreactive  Procedures: None  Consults: ENT  Subjective: No new complaints-claims she feels the same as yesterday-just about tolerating full liquids.  Objective: Vitals: Blood pressure 111/80, pulse (!) 57, temperature 98.6 F (37 C), temperature source Oral, resp. rate 18, height 5' 2.99 (1.6 m), weight 51.7 kg, last menstrual period 05/22/2024, SpO2 96%.   Exam: Throat exam: Enlarged-erythematous tonsils-with whitish patch. No stridor-very comfortable  Pertinent Labs/Radiology:    Latest Ref Rng & Units 06/23/2024    3:17 AM 06/22/2024    2:35 AM 06/21/2024    5:07 AM  CBC  WBC 4.0 - 10.5 K/uL 14.0  17.7  20.6   Hemoglobin 12.0 - 15.0 g/dL 87.5  88.0  88.1   Hematocrit 36.0 - 46.0 % 37.9  36.6  36.7   Platelets 150 - 400 K/uL 253  252  186     Lab Results  Component Value Date   NA 136 06/23/2024   K 5.0 06/23/2024   CL 99  06/23/2024   CO2 25 06/23/2024      Assessment/Plan: Upper airway obstruction secondary to severe adenotonsillar hypertrophy Unchanged overnight Just about able to tolerate full liquids Appears comfortable--no stridor Remains on empiric Unasyn Decadron x 2 days will be completed today ENT following Continue inpatient monitoring-await further recommendations from ENT.  Transaminitis Mild Suspect likely secondary to EBV infection Downtrending-Will continue to watch/observe-if worsens then will commence further workup.    Leukocytosis Probably secondary to steroids/infectious mononucleosis-WBC count downtrending Follow periodically.  Code status:   Code Status: Full Code   DVT Prophylaxis: enoxaparin (LOVENOX) injection 40 mg Start: 06/22/24 1800 SCDs Start: 06/21/24 0137   Family Communication: Mother at bedside   Disposition Plan: Status is: Inpatient Remains inpatient appropriate because: Severity of infection   Planned Discharge Destination:Home   Diet: Diet Order             Diet full liquid Room service appropriate? Yes; Fluid consistency: Thin  Diet effective now                     Antimicrobial agents: Anti-infectives (From admission, onward)    Start     Dose/Rate Route Frequency Ordered Stop   06/21/24 0400  Ampicillin-Sulbactam (UNASYN) 3 g in sodium chloride 0.9 % 100 mL IVPB        3 g 200 mL/hr over 30 Minutes Intravenous Every 6 hours 06/21/24 0201     06/20/24 2115  Ampicillin-Sulbactam (UNASYN) 3 g in sodium chloride 0.9 % 100 mL  IVPB        3 g 200 mL/hr over 30 Minutes Intravenous  Once 06/20/24 2108 06/20/24 2220        MEDICATIONS: Scheduled Meds:  enoxaparin (LOVENOX) injection  40 mg Subcutaneous Q24H   Continuous Infusions:  ampicillin-sulbactam (UNASYN) IV 3 g (06/23/24 0824)   PRN Meds:.acetaminophen, melatonin, morphine injection, oxyCODONE, polyethylene glycol, prochlorperazine, Racepinephrine HCl   I have  personally reviewed following labs and imaging studies  LABORATORY DATA: CBC: Recent Labs  Lab 06/18/24 1122 06/20/24 2105 06/21/24 0507 06/22/24 0235 06/23/24 0317  WBC 17.7* 27.2* 20.6* 17.7* 14.0*  NEUTROABS 6.7 16.3* 7.1 8.0*  --   HGB 14.0 13.4 11.8* 11.9* 12.4  HCT 41.8 42.5 36.7 36.6 37.9  MCV 86.7 89.1 90.0 88.2 86.7  PLT 205 245 186 252 253    Basic Metabolic Panel: Recent Labs  Lab 06/18/24 1408 06/20/24 2105 06/21/24 0507 06/22/24 0235 06/23/24 0317  NA 138 133* 133* 135 136  K 3.6 4.6 4.2 3.7 5.0  CL 103 97* 98 100 99  CO2 22 24 23 23 25   GLUCOSE 116* 93 104* 111* 128*  BUN 7 7 7 7 11   CREATININE 0.52 0.61 0.69 0.63 0.63  CALCIUM 8.9 8.9 8.0* 7.5* 8.3*  MG  --   --  2.1 2.0  --   PHOS  --   --  4.0  --   --     GFR: Estimated Creatinine Clearance: 92.3 mL/min (by C-G formula based on SCr of 0.63 mg/dL).  Liver Function Tests: Recent Labs  Lab 06/18/24 1122 06/21/24 0507 06/22/24 0235 06/23/24 0317  AST 67* 146* 156* 103*  ALT 72* 120* 128* 122*  ALKPHOS 98 92 83 77  BILITOT 0.5 1.1 0.7 0.5  PROT 9.0* 7.2 7.3 7.8  ALBUMIN 4.4 2.8* 2.8* 2.9*   Recent Labs  Lab 06/18/24 1122  LIPASE 21   No results for input(s): AMMONIA in the last 168 hours.  Coagulation Profile: No results for input(s): INR, PROTIME in the last 168 hours.  Cardiac Enzymes: No results for input(s): CKTOTAL, CKMB, CKMBINDEX, TROPONINI in the last 168 hours.  BNP (last 3 results) No results for input(s): PROBNP in the last 8760 hours.  Lipid Profile: No results for input(s): CHOL, HDL, LDLCALC, TRIG, CHOLHDL, LDLDIRECT in the last 72 hours.  Thyroid Function Tests: No results for input(s): TSH, T4TOTAL, FREET4, T3FREE, THYROIDAB in the last 72 hours.  Anemia Panel: No results for input(s): VITAMINB12, FOLATE, FERRITIN, TIBC, IRON, RETICCTPCT in the last 72 hours.  Urine analysis:    Component Value Date/Time    COLORURINE YELLOW 06/01/2014 1411   APPEARANCEUR CLEAR 06/01/2014 1411   LABSPEC 1.024 06/01/2014 1411   PHURINE 6.5 06/01/2014 1411   GLUCOSEU NEGATIVE 06/01/2014 1411   HGBUR NEGATIVE 06/01/2014 1411   BILIRUBINUR NEGATIVE 06/01/2014 1411   KETONESUR NEGATIVE 06/01/2014 1411   PROTEINUR NEGATIVE 06/01/2014 1411   UROBILINOGEN 0.2 06/01/2014 1411   NITRITE NEGATIVE 06/01/2014 1411   LEUKOCYTESUR SMALL (A) 06/01/2014 1411    Sepsis Labs: Lactic Acid, Venous    Component Value Date/Time   LATICACIDVEN 1.4 06/21/2024 0507    MICROBIOLOGY: Recent Results (from the past 240 hours)  Group A Strep by PCR if patient complains of sore throat.     Status: None   Collection Time: 06/18/24 10:32 AM   Specimen: Throat; Sterile Swab  Result Value Ref Range Status   Group A Strep by PCR NOT DETECTED NOT DETECTED Final  Comment: Performed at [facility], [address] [place], [place] [address]  Resp panel by RT-PCR (RSV, Flu A&B, Covid) Anterior Nasal Swab     Status: None   Collection Time: 06/18/24 10:32 AM   Specimen: Anterior Nasal Swab  Result Value Ref Range Status   SARS Coronavirus 2 by RT PCR NEGATIVE NEGATIVE Final    Comment: (NOTE) SARS-CoV-2 target nucleic acids are NOT DETECTED.  The SARS-CoV-2 RNA is generally detectable in upper respiratory specimens during the acute phase of infection. The lowest concentration of SARS-CoV-2 viral copies this assay can detect is 138 copies/mL. A negative result does not preclude SARS-Cov-2 infection and should not be used as the sole basis for treatment or other patient management decisions. A negative result may occur with  improper specimen collection/handling, submission of specimen other than nasopharyngeal swab, presence of viral mutation(s) within the areas targeted by this assay, and inadequate number of viral copies(<138 copies/mL). A negative result must be combined with clinical observations, patient history,  and epidemiological information. The expected result is Negative.  Fact Sheet for Patients:  bloggercourse.com  Fact Sheet for Healthcare Providers:  seriousbroker.it  This test is no t yet approved or cleared by the [place]  FDA and  has been authorized for detection and/or diagnosis of SARS-CoV-2 by FDA under an Emergency Use Authorization (EUA). This EUA will remain  in effect (meaning this test can be used) for the duration of the COVID-19 declaration under Section 564(b)(1) of the Act, 21 U.S.C.section 360bbb-3(b)(1), unless the authorization is terminated  or revoked sooner.       Influenza A by PCR NEGATIVE NEGATIVE Final   Influenza B by PCR NEGATIVE NEGATIVE Final    Comment: (NOTE) The Xpert Xpress SARS-CoV-2/FLU/RSV plus assay is intended as an aid in the diagnosis of influenza from Nasopharyngeal swab specimens and should not be used as a sole basis for treatment. Nasal washings and aspirates are unacceptable for Xpert Xpress SARS-CoV-2/FLU/RSV testing.  Fact Sheet for Patients: bloggercourse.com  Fact Sheet for Healthcare Providers: seriousbroker.it  This test is not yet approved or cleared by the [place]  FDA and has been authorized for detection and/or diagnosis of SARS-CoV-2 by FDA under an Emergency Use Authorization (EUA). This EUA will remain in effect (meaning this test can be used) for the duration of the COVID-19 declaration under Section 564(b)(1) of the Act, 21 U.S.C. section 360bbb-3(b)(1), unless the authorization is terminated or revoked.     Resp Syncytial Virus by PCR NEGATIVE NEGATIVE Final    Comment: (NOTE) Fact Sheet for Patients: bloggercourse.com  Fact Sheet for Healthcare Providers: seriousbroker.it  This test is not yet approved or cleared by the [place]  FDA and has  been authorized for detection and/or diagnosis of SARS-CoV-2 by FDA under an Emergency Use Authorization (EUA). This EUA will remain in effect (meaning this test can be used) for the duration of the COVID-19 declaration under Section 564(b)(1) of the Act, 21 U.S.C. section 360bbb-3(b)(1), unless the authorization is terminated or revoked.  Performed at [facility], [address] [place], [place] [address]   Blood culture (routine x 2)     Status: None (Preliminary result)   Collection Time: 06/20/24 11:13 PM   Specimen: BLOOD  Result Value Ref Range Status   Specimen Description BLOOD SITE NOT SPECIFIED  Final   Special Requests   Final    BOTTLES DRAWN AEROBIC AND ANAEROBIC Blood Culture adequate volume   Culture  Final    NO GROWTH 3 DAYS Performed at [facility], [address], [place], [place] [address]    Report Status PENDING  Incomplete  Blood culture (routine x 2)     Status: None (Preliminary result)   Collection Time: 06/21/24 12:38 PM   Specimen: BLOOD LEFT HAND  Result Value Ref Range Status   Specimen Description BLOOD LEFT HAND  Final   Special Requests   Final    BOTTLES DRAWN AEROBIC ONLY Blood Culture results may not be optimal due to an inadequate volume of blood received in culture bottles   Culture   Final    NO GROWTH 2 DAYS Performed at [facility], [address] [place], [place] [address]    Report Status PENDING  Incomplete    RADIOLOGY STUDIES/RESULTS: No results found.    LOS: 2 days   Donalda Applebaum, MD  [facility]    To contact the attending provider between 7A-7P or the covering provider during after hours 7P-7A, please log into the web site [email or web address] and access using universal [place] password for that web site. If you do not have the password, please call the hospital operator.  06/23/2024, 11:09 AM

## 2024-06-23 NOTE — Plan of Care (Signed)

## 2024-06-24 DIAGNOSIS — B2799 Infectious mononucleosis, unspecified with other complication: Secondary | ICD-10-CM | POA: Diagnosis not present

## 2024-06-24 LAB — HEPATIC FUNCTION PANEL
ALT: 95 U/L — ABNORMAL HIGH (ref 0–44)
AST: 71 U/L — ABNORMAL HIGH (ref 15–41)
Albumin: 2.7 g/dL — ABNORMAL LOW (ref 3.5–5.0)
Alkaline Phosphatase: 74 U/L (ref 38–126)
Bilirubin, Direct: 0.1 mg/dL (ref 0.0–0.2)
Indirect Bilirubin: 0.5 mg/dL (ref 0.3–0.9)
Total Bilirubin: 0.6 mg/dL (ref 0.0–1.2)
Total Protein: 7.5 g/dL (ref 6.5–8.1)

## 2024-06-24 MED ORDER — PREDNISONE 10 MG PO TABS
ORAL_TABLET | ORAL | 0 refills | Status: AC
Start: 2024-06-24 — End: ?

## 2024-06-24 NOTE — Progress Notes (Signed)
 PROGRESS NOTE        PATIENT DETAILS Name: Renee [name] Age: 18 y.o. Sex: female Date of Birth: [birth year] Admit Date: 06/20/2024 Admitting Physician Terry LOISE Hurst, DO ERE:Jixpwd, Ronal Crank, PA-C  Brief Summary: Patient is a 18 y.o.  female who presented with sore throat/difficulty swallowing x 1 week-prior outpatient workup suggestive of infectious mononucleosis-subsequently found to have severe enlargement of tonsils with upper airway obstruction-ENT consulted and subsequently admitted to the hospitalist service.  Significant events: 11/4>> admit to [facility]  Significant studies: 11/3>> CT neck: Enlarged tonsils with striated enhancement-consistent with tonsillitis.  Marked lymphadenopathy throughout neck bilaterally.  Significant microbiology data: 10/30>> rapid strep A: Negative (results in Care Everywhere) 10/30>> infectious mononucleosis antibody: Positive (results in Care Everywhere) 11/01>> COVID/influenza/RSV PCR: Negative 11 3>> blood culture: No growth 11/4>> blood culture: No growth 11/04>> HIV: Nonreactive  Procedures: None  Consults: ENT  Subjective: No new issues-unchanged for the past 48 hours-just about tolerating full liquids.  Objective: Vitals: Blood pressure (!) 103/53, pulse 82, temperature 97.9 F (36.6 C), temperature source Oral, resp. rate 18, height 5' 2.99 (1.6 m), weight 51.7 kg, last menstrual period 05/22/2024, SpO2 98%.   Exam: Throat exam: Enlarged-erythematous tonsils-with whitish patch. No stridor-very comfortable  Pertinent Labs/Radiology:    Latest Ref Rng & Units 06/23/2024    3:17 AM 06/22/2024    2:35 AM 06/21/2024    5:07 AM  CBC  WBC 4.0 - 10.5 K/uL 14.0  17.7  20.6   Hemoglobin 12.0 - 15.0 g/dL 87.5  88.0  88.1   Hematocrit 36.0 - 46.0 % 37.9  36.6  36.7   Platelets 150 - 400 K/uL 253  252  186     Lab Results  Component Value Date   NA 136 06/23/2024   K 5.0 06/23/2024   CL 99 06/23/2024    CO2 25 06/23/2024      Assessment/Plan: Upper airway obstruction secondary to severe adenotonsillar hypertrophy Unchanged overnight Just about able to tolerate full liquids Appears comfortable--no stridor Remains on empiric Unasyn x 5 days Decadron x 2 days will be completed today ENT following Continue inpatient monitoring-await further recommendations from ENT.  Transaminitis Mild Suspect likely secondary to EBV infection Downtrending-Will continue to watch/observe-if worsens then will commence further workup.    Leukocytosis Probably secondary to steroids/infectious mononucleosis-WBC count downtrending Follow periodically.  Code status:   Code Status: Full Code   DVT Prophylaxis: enoxaparin (LOVENOX) injection 40 mg Start: 06/22/24 1800 SCDs Start: 06/21/24 0137   Family Communication: Mother at bedside   Disposition Plan: Status is: Inpatient Remains inpatient appropriate because: Severity of infection   Planned Discharge Destination:Home   Diet: Diet Order             Diet full liquid Room service appropriate? Yes; Fluid consistency: Thin  Diet effective now                     Antimicrobial agents: Anti-infectives (From admission, onward)    Start     Dose/Rate Route Frequency Ordered Stop   06/21/24 0400  Ampicillin-Sulbactam (UNASYN) 3 g in sodium chloride 0.9 % 100 mL IVPB        3 g 200 mL/hr over 30 Minutes Intravenous Every 6 hours 06/21/24 0201     06/20/24 2115  Ampicillin-Sulbactam (UNASYN) 3 g in sodium chloride 0.9 %  100 mL IVPB        3 g 200 mL/hr over 30 Minutes Intravenous  Once 06/20/24 2108 06/20/24 2220        MEDICATIONS: Scheduled Meds:  enoxaparin (LOVENOX) injection  40 mg Subcutaneous Q24H   Continuous Infusions:  ampicillin-sulbactam (UNASYN) IV 3 g (06/24/24 0954)   PRN Meds:.acetaminophen, melatonin, morphine injection, oxyCODONE, polyethylene glycol, prochlorperazine, Racepinephrine HCl   I have  personally reviewed following labs and imaging studies  LABORATORY DATA: CBC: Recent Labs  Lab 06/18/24 1122 06/20/24 2105 06/21/24 0507 06/22/24 0235 06/23/24 0317  WBC 17.7* 27.2* 20.6* 17.7* 14.0*  NEUTROABS 6.7 16.3* 7.1 8.0*  --   HGB 14.0 13.4 11.8* 11.9* 12.4  HCT 41.8 42.5 36.7 36.6 37.9  MCV 86.7 89.1 90.0 88.2 86.7  PLT 205 245 186 252 253    Basic Metabolic Panel: Recent Labs  Lab 06/18/24 1408 06/20/24 2105 06/21/24 0507 06/22/24 0235 06/23/24 0317  NA 138 133* 133* 135 136  K 3.6 4.6 4.2 3.7 5.0  CL 103 97* 98 100 99  CO2 22 24 23 23 25   GLUCOSE 116* 93 104* 111* 128*  BUN 7 7 7 7 11   CREATININE 0.52 0.61 0.69 0.63 0.63  CALCIUM 8.9 8.9 8.0* 7.5* 8.3*  MG  --   --  2.1 2.0  --   PHOS  --   --  4.0  --   --     GFR: Estimated Creatinine Clearance: 92.3 mL/min (by C-G formula based on SCr of 0.63 mg/dL).  Liver Function Tests: Recent Labs  Lab 06/18/24 1122 06/21/24 0507 06/22/24 0235 06/23/24 0317 06/24/24 0333  AST 67* 146* 156* 103* 71*  ALT 72* 120* 128* 122* 95*  ALKPHOS 98 92 83 77 74  BILITOT 0.5 1.1 0.7 0.5 0.6  PROT 9.0* 7.2 7.3 7.8 7.5  ALBUMIN 4.4 2.8* 2.8* 2.9* 2.7*   Recent Labs  Lab 06/18/24 1122  LIPASE 21   No results for input(s): AMMONIA in the last 168 hours.  Coagulation Profile: No results for input(s): INR, PROTIME in the last 168 hours.  Cardiac Enzymes: No results for input(s): CKTOTAL, CKMB, CKMBINDEX, TROPONINI in the last 168 hours.  BNP (last 3 results) No results for input(s): PROBNP in the last 8760 hours.  Lipid Profile: No results for input(s): CHOL, HDL, LDLCALC, TRIG, CHOLHDL, LDLDIRECT in the last 72 hours.  Thyroid Function Tests: No results for input(s): TSH, T4TOTAL, FREET4, T3FREE, THYROIDAB in the last 72 hours.  Anemia Panel: No results for input(s): VITAMINB12, FOLATE, FERRITIN, TIBC, IRON, RETICCTPCT in the last 72 hours.  Urine  analysis:    Component Value Date/Time   COLORURINE YELLOW 06/01/2014 1411   APPEARANCEUR CLEAR 06/01/2014 1411   LABSPEC 1.024 06/01/2014 1411   PHURINE 6.5 06/01/2014 1411   GLUCOSEU NEGATIVE 06/01/2014 1411   HGBUR NEGATIVE 06/01/2014 1411   BILIRUBINUR NEGATIVE 06/01/2014 1411   KETONESUR NEGATIVE 06/01/2014 1411   PROTEINUR NEGATIVE 06/01/2014 1411   UROBILINOGEN 0.2 06/01/2014 1411   NITRITE NEGATIVE 06/01/2014 1411   LEUKOCYTESUR SMALL (A) 06/01/2014 1411    Sepsis Labs: Lactic Acid, Venous    Component Value Date/Time   LATICACIDVEN 1.4 06/21/2024 0507    MICROBIOLOGY: Recent Results (from the past 240 hours)  Group A Strep by PCR if patient complains of sore throat.     Status: None   Collection Time: 06/18/24 10:32 AM   Specimen: Throat; Sterile Swab  Result Value Ref Range Status   Group  A Strep by PCR NOT DETECTED NOT DETECTED Final    Comment: Performed at [facility], [address] [place], [place] [address]  Resp panel by RT-PCR (RSV, Flu A&B, Covid) Anterior Nasal Swab     Status: None   Collection Time: 06/18/24 10:32 AM   Specimen: Anterior Nasal Swab  Result Value Ref Range Status   SARS Coronavirus 2 by RT PCR NEGATIVE NEGATIVE Final    Comment: (NOTE) SARS-CoV-2 target nucleic acids are NOT DETECTED.  The SARS-CoV-2 RNA is generally detectable in upper respiratory specimens during the acute phase of infection. The lowest concentration of SARS-CoV-2 viral copies this assay can detect is 138 copies/mL. A negative result does not preclude SARS-Cov-2 infection and should not be used as the sole basis for treatment or other patient management decisions. A negative result may occur with  improper specimen collection/handling, submission of specimen other than nasopharyngeal swab, presence of viral mutation(s) within the areas targeted by this assay, and inadequate number of viral copies(<138 copies/mL). A negative result must be combined  with clinical observations, patient history, and epidemiological information. The expected result is Negative.  Fact Sheet for Patients:  bloggercourse.com  Fact Sheet for Healthcare Providers:  seriousbroker.it  This test is no t yet approved or cleared by the [place]  FDA and  has been authorized for detection and/or diagnosis of SARS-CoV-2 by FDA under an Emergency Use Authorization (EUA). This EUA will remain  in effect (meaning this test can be used) for the duration of the COVID-19 declaration under Section 564(b)(1) of the Act, 21 U.S.C.section 360bbb-3(b)(1), unless the authorization is terminated  or revoked sooner.       Influenza A by PCR NEGATIVE NEGATIVE Final   Influenza B by PCR NEGATIVE NEGATIVE Final    Comment: (NOTE) The Xpert Xpress SARS-CoV-2/FLU/RSV plus assay is intended as an aid in the diagnosis of influenza from Nasopharyngeal swab specimens and should not be used as a sole basis for treatment. Nasal washings and aspirates are unacceptable for Xpert Xpress SARS-CoV-2/FLU/RSV testing.  Fact Sheet for Patients: bloggercourse.com  Fact Sheet for Healthcare Providers: seriousbroker.it  This test is not yet approved or cleared by the [place]  FDA and has been authorized for detection and/or diagnosis of SARS-CoV-2 by FDA under an Emergency Use Authorization (EUA). This EUA will remain in effect (meaning this test can be used) for the duration of the COVID-19 declaration under Section 564(b)(1) of the Act, 21 U.S.C. section 360bbb-3(b)(1), unless the authorization is terminated or revoked.     Resp Syncytial Virus by PCR NEGATIVE NEGATIVE Final    Comment: (NOTE) Fact Sheet for Patients: bloggercourse.com  Fact Sheet for Healthcare Providers: seriousbroker.it  This test is not yet approved  or cleared by the [place]  FDA and has been authorized for detection and/or diagnosis of SARS-CoV-2 by FDA under an Emergency Use Authorization (EUA). This EUA will remain in effect (meaning this test can be used) for the duration of the COVID-19 declaration under Section 564(b)(1) of the Act, 21 U.S.C. section 360bbb-3(b)(1), unless the authorization is terminated or revoked.  Performed at [facility], [address] [place], [place] [address]   Blood culture (routine x 2)     Status: None (Preliminary result)   Collection Time: 06/20/24 11:13 PM   Specimen: BLOOD  Result Value Ref Range Status   Specimen Description BLOOD SITE NOT SPECIFIED  Final   Special Requests   Final    BOTTLES  DRAWN AEROBIC AND ANAEROBIC Blood Culture adequate volume   Culture   Final    NO GROWTH 4 DAYS Performed at [facility], [address] [place], [place] [address]    Report Status PENDING  Incomplete  Blood culture (routine x 2)     Status: None (Preliminary result)   Collection Time: 06/21/24 12:38 PM   Specimen: BLOOD LEFT HAND  Result Value Ref Range Status   Specimen Description BLOOD LEFT HAND  Final   Special Requests   Final    BOTTLES DRAWN AEROBIC ONLY Blood Culture results may not be optimal due to an inadequate volume of blood received in culture bottles   Culture   Final    NO GROWTH 3 DAYS Performed at [facility], [address] [place], [place] [address]    Report Status PENDING  Incomplete    RADIOLOGY STUDIES/RESULTS: No results found.    LOS: 3 days   Donalda Applebaum, MD  [facility]    To contact the attending provider between 7A-7P or the covering provider during after hours 7P-7A, please log into the web site [email or web address] and access using universal [place] password for that web site. If you do not have the password, please call the hospital operator.  06/24/2024, 12:21 PM

## 2024-06-24 NOTE — Plan of Care (Signed)

## 2024-06-24 NOTE — Discharge Summary (Addendum)
 PATIENT DETAILS Name: Renee [name] Age: 18 y.o. Sex: female Date of Birth: [birth year] MRN: [id number]. Admitting Physician: Terry LOISE Hurst, DO ERE:Jixpwd, Ronal Crank, PA-C  Admit Date: 06/20/2024 Discharge date: 06/24/2024  Recommendations for Outpatient Follow-up:  Follow up with PCP in 1-2 weeks Please obtain CMP/CBC in one week Repeat CT neck in 4-6 weeks after resolution of current illness to ensure resolution of lymphadenopathy  Admitted From:  Home  Disposition: Home   Discharge Condition: good  CODE STATUS:   Code Status: Full Code   Diet recommendation:  Diet Order             Diet full liquid           Diet full liquid Room service appropriate? Yes; Fluid consistency: Thin  Diet effective now                    Brief Summary: Patient is a 18 y.o.  female who presented with sore throat/difficulty swallowing x 1 week-prior outpatient workup suggestive of infectious mononucleosis-subsequently found to have severe enlargement of tonsils with upper airway obstruction-ENT consulted and subsequently admitted to the hospitalist service.   Significant events: 11/4>> admit to [facility]   Significant studies: 11/3>> CT neck: Enlarged tonsils with striated enhancement-consistent with tonsillitis.  Marked lymphadenopathy throughout neck bilaterally.   Significant microbiology data: 10/30>> rapid strep A: Negative (results in Care Everywhere) 10/30>> infectious mononucleosis antibody: Positive (results in Care Everywhere) 11/01>> COVID/influenza/RSV PCR: Negative 11 3>> blood culture: No growth 11/4>> blood culture: No growth 11/04>> HIV: Nonreactive   Procedures: None   Consults: ENT  Brief Hospital Course: Upper airway obstruction secondary to severe adenotonsillar hypertrophy due to infectious mononucleosis with surrounding lymphadenopathy Patient was admitted and started on IV Decadron-and IV Unasyn.  She gradually improved-subsequently tolerated  clear liquids and was advanced to full liquids.   She appears comfortable-tolerating full liquids well and no overt issues with shortness of breath or stridor. Per prior documentation-she refused BiPAP use at night. Although improved in how she first presented-her improvement has plateaued for the past 2 days or so-I was informed by the nurse earlier this evening that patient's mother (who was at bedside throughout this hospitalization) was requesting discharge home.  Per nursing staff-patient's mother would bring the patient back to the ED if she were to worsen.   I subsequently reached out to patient's ENT MD-Dr. Renita felt that it was okay for her to go home given overall clinical improvement and able to tolerate diet and no other major issues.  He suggested that we put her on steroids for at least another week or so.   Subsequently I spoke with patient and her mother at bedside, explained need for very close supervision by family (mother able to do so) and we went over some of concerning findings if she were to develop (stridor, difficulty swallowing, hoarseness, fever)-that will necessitate immediate return to the emergency room.  Both patient/mother explained understanding and are very comfortable going home today.     Transaminitis Mild Suspect likely secondary to EBV infection 6 frequent improvement in liver enzymes-PCP to repeat at next visit-to ensure that LFTs continue to downtrend and subsequently normalized.   Leukocytosis Probably secondary to steroids/infectious mononucleosis-WBC count downtrending Follow periodically.    Discharge Diagnoses:  Principal Problem:   Infectious mononucleosis, with other complication, infectious mononucleosis due to unspecified organism   Discharge Instructions:  Activity:  As tolerated   Discharge Instructions     Call MD  for:   Complete by: As directed    Difficulty swallowing, difficulty breathing/stridor, worsening hoarseness  of voice   Call MD for:  difficulty breathing, headache or visual disturbances   Complete by: As directed    Diet full liquid   Complete by: As directed    Stay on full liquid diet until you are evaluated by your primary care practitioner.   Discharge instructions   Complete by: As directed    Follow with Primary MD  [name], PA-C in 1-2 weeks  Please seek immediate medical attention if you develop worsening shortness of breath, stridor, or worsening difficulty with swallowing.  Please get a complete blood count and chemistry panel checked by your Primary MD at your next visit, and again as instructed by your Primary MD.  Get Medicines reviewed and adjusted: Please take all your medications with you for your next visit with your Primary MD  Laboratory/radiological data: Please request your Primary MD to go over all hospital tests and procedure/radiological results at the follow up, please ask your Primary MD to get all Hospital records sent to his/her office.  In some cases, they will be blood work, cultures and biopsy results pending at the time of your discharge. Please request that your primary care M.D. follows up on these results.  Also Note the following: If you experience worsening of your admission symptoms, develop shortness of breath, life threatening emergency, suicidal or homicidal thoughts you must seek medical attention immediately by calling 911 or calling your MD immediately  if symptoms less severe.  You must read complete instructions/literature along with all the possible adverse reactions/side effects for all the Medicines you take and that have been prescribed to you. Take any new Medicines after you have completely understood and accpet all the possible adverse reactions/side effects.   Do not drive when taking Pain medications or sleeping medications (Benzodaizepines)  Do not take more than prescribed Pain, Sleep and Anxiety Medications. It is not  advisable to combine anxiety,sleep and pain medications without talking with your primary care practitioner  Special Instructions: If you have smoked or chewed Tobacco  in the last 2 yrs please stop smoking, stop any regular Alcohol  and or any Recreational drug use.  Wear Seat belts while driving.  Please note: You were cared for by a hospitalist during your hospital stay. Once you are discharged, your primary care physician will handle any further medical issues. Please note that NO REFILLS for any discharge medications will be authorized once you are discharged, as it is imperative that you return to your primary care physician (or establish a relationship with a primary care physician if you do not have one) for your post hospital discharge needs so that they can reassess your need for medications and monitor your lab values.   Increase activity slowly   Complete by: As directed       Allergies as of 06/24/2024   No Known Allergies      Medication List     STOP taking these medications    dexamethasone 4 MG tablet Commonly known as: DECADRON   metroNIDAZOLE 500 MG tablet Commonly known as: FLAGYL       TAKE these medications    ibuprofen  200 MG tablet Commonly known as: ADVIL  Take 200-800 mg by mouth every 6 (six) hours as needed for moderate pain (pain score 4-6).   NEXPLANON [place] Inject 1 Device into the skin continuous.   ondansetron  4 MG tablet Commonly known  as: ZOFRAN  Take 1 tablet (4 mg total) by mouth every 8 (eight) hours as needed for nausea or vomiting.   predniSONE 10 MG tablet Commonly known as: DELTASONE Take 40 mg daily for 2 days, 30 mg daily for 2 days, 20 mg daily for 2 days,10 mg daily for 1 days, then stop        Follow-up Information     [name], PA-C. Call in 1 week(s).   Specialty: Physician Assistant Contact information: [address] [place] [address] [phone]         Maggie Hussar,  MD. Schedule an appointment as soon as possible for a visit in 1 week(s).   Specialty: Otolaryngology Contact information: [address] [place] [address] [place] [address] [phone]                No Known Allergies   Other Procedures/Studies: CT Soft Tissue Neck W Contrast Result Date: 06/20/2024 EXAM: CT NECK WITH CONTRAST 06/20/2024 10:33:01 PM TECHNIQUE: CT of the neck was performed with the administration of intravenous contrast. Multiplanar reformatted images are provided for review. Automated exposure control, iterative reconstruction, and/or weight based adjustment of the mA/kV was utilized to reduce the radiation dose to as low as reasonably achievable. COMPARISON: None available. CLINICAL HISTORY: Soft tissue infection suspected, neck, xray done FINDINGS: AERODIGESTIVE TRACT: Enlarged tonsils with striated enhancement. No discrete, drainable fluid collection. SALIVARY GLANDS: The parotid and submandibular glands are unremarkable. THYROID: Unremarkable. LYMPH NODES: Marked lymphadenopathy throughout the neck bilaterally. SOFT TISSUES: No mass or fluid collection. BRAIN, ORBITS, SINUSES AND MASTOIDS: No acute abnormality. LUNGS AND MEDIASTINUM: No acute abnormality. BONES: No focal bone abnormality. IMPRESSION: 1. Enlarged tonsils with striated enhancement, compatible with tonsillitis. No discrete, drainable fluid collection. 2. Marked lymphadenopathy throughout the neck bilaterally, nonspecific but potentially reactive given the above findings. Recommend close clinical follow-up to ensure resolution and to exclude a lymphoproliferative disorder. Electronically signed by: Gilmore Molt MD 06/20/2024 10:54 PM EST RP Workstation: [id number]   DG Chest 2 View Result Date: 06/20/2024 EXAM: 2 VIEW(S) XRAY OF THE CHEST 06/20/2024 09:23:00 PM COMPARISON: None available. CLINICAL HISTORY: 141880 SOB (shortness of breath) 141880 SOB (shortness of breath) FINDINGS: LUNGS AND PLEURA: No focal  pulmonary opacity. No pulmonary edema. No pleural effusion. No pneumothorax. HEART AND MEDIASTINUM: No acute abnormality of the cardiac and mediastinal silhouettes. BONES AND SOFT TISSUES: No acute osseous abnormality. IMPRESSION: 1. No acute process. Electronically signed by: Morgane Naveau MD 06/20/2024 09:26 PM EST RP Workstation: [id number]     TODAY-DAY OF DISCHARGE:  Subjective:   Renee [name] today has no headache,no chest abdominal pain,no new weakness tingling or numbness, feels much better wants to go home today.  Objective:   Blood pressure 125/76, pulse 82, temperature 99.6 F (37.6 C), temperature source Oral, resp. rate 18, height 5' 2.99 (1.6 m), weight 51.7 kg, last menstrual period 05/22/2024, SpO2 98%.  Intake/Output Summary (Last 24 hours) at 06/24/2024 1803 Last data filed at 06/23/2024 1935 Gross per 24 hour  Intake --  Output 0 ml  Net 0 ml   Filed Weights   06/23/24 0042  Weight: 51.7 kg    Exam: Awake Alert, Oriented *3, No new F.N deficits, Normal affect [place].AT,PERRAL Supple Neck,No JVD, No cervical lymphadenopathy appriciated.  Symmetrical Chest wall movement, Good air movement bilaterally, CTAB RRR,No Gallops,Rubs or new Murmurs, No Parasternal Heave +ve B.Sounds, Abd Soft, Non tender, No organomegaly appriciated, No rebound -guarding or rigidity. No Cyanosis, Clubbing  or edema, No new Rash or bruise   PERTINENT RADIOLOGIC STUDIES: No results found.   PERTINENT LAB RESULTS: CBC: Recent Labs    06/22/24 0235 06/23/24 0317  WBC 17.7* 14.0*  HGB 11.9* 12.4  HCT 36.6 37.9  PLT 252 253   CMET CMP     Component Value Date/Time   NA 136 06/23/2024 0317   K 5.0 06/23/2024 0317   CL 99 06/23/2024 0317   CO2 25 06/23/2024 0317   GLUCOSE 128 (H) 06/23/2024 0317   BUN 11 06/23/2024 0317   CREATININE 0.63 06/23/2024 0317   CALCIUM 8.3 (L) 06/23/2024 0317   PROT 7.5 06/24/2024 0333   ALBUMIN 2.7 (L) 06/24/2024 0333   AST 71 (H) 06/24/2024  0333   ALT 95 (H) 06/24/2024 0333   ALKPHOS 74 06/24/2024 0333   BILITOT 0.6 06/24/2024 0333   GFRNONAA >60 06/23/2024 0317    GFR Estimated Creatinine Clearance: 92.3 mL/min (by C-G formula based on SCr of 0.63 mg/dL). No results for input(s): LIPASE, AMYLASE in the last 72 hours. No results for input(s): CKTOTAL, CKMB, CKMBINDEX, TROPONINI in the last 72 hours. Invalid input(s): POCBNP No results for input(s): DDIMER in the last 72 hours. No results for input(s): HGBA1C in the last 72 hours. No results for input(s): CHOL, HDL, LDLCALC, TRIG, CHOLHDL, LDLDIRECT in the last 72 hours. No results for input(s): TSH, T4TOTAL, T3FREE, THYROIDAB in the last 72 hours.  Invalid input(s): FREET3 No results for input(s): VITAMINB12, FOLATE, FERRITIN, TIBC, IRON, RETICCTPCT in the last 72 hours. Coags: No results for input(s): INR in the last 72 hours.  Invalid input(s): PT Microbiology: Recent Results (from the past 240 hours)  Group A Strep by PCR if patient complains of sore throat.     Status: None   Collection Time: 06/18/24 10:32 AM   Specimen: Throat; Sterile Swab  Result Value Ref Range Status   Group A Strep by PCR NOT DETECTED NOT DETECTED Final    Comment: Performed at [facility], [address] [place], [place] [address]  Resp panel by RT-PCR (RSV, Flu A&B, Covid) Anterior Nasal Swab     Status: None   Collection Time: 06/18/24 10:32 AM   Specimen: Anterior Nasal Swab  Result Value Ref Range Status   SARS Coronavirus 2 by RT PCR NEGATIVE NEGATIVE Final    Comment: (NOTE) SARS-CoV-2 target nucleic acids are NOT DETECTED.  The SARS-CoV-2 RNA is generally detectable in upper respiratory specimens during the acute phase of infection. The lowest concentration of SARS-CoV-2 viral copies this assay can detect is 138 copies/mL. A negative result does not preclude SARS-Cov-2 infection and should not be used as  the sole basis for treatment or other patient management decisions. A negative result may occur with  improper specimen collection/handling, submission of specimen other than nasopharyngeal swab, presence of viral mutation(s) within the areas targeted by this assay, and inadequate number of viral copies(<138 copies/mL). A negative result must be combined with clinical observations, patient history, and epidemiological information. The expected result is Negative.  Fact Sheet for Patients:  bloggercourse.com  Fact Sheet for Healthcare Providers:  seriousbroker.it  This test is no t yet approved or cleared by the [place]  FDA and  has been authorized for detection and/or diagnosis of SARS-CoV-2 by FDA under an Emergency Use Authorization (EUA). This EUA will remain  in effect (meaning this test can be used) for the duration of the COVID-19 declaration under Section 564(b)(1) of the Act,  21 U.S.C.section 360bbb-3(b)(1), unless the authorization is terminated  or revoked sooner.       Influenza A by PCR NEGATIVE NEGATIVE Final   Influenza B by PCR NEGATIVE NEGATIVE Final    Comment: (NOTE) The Xpert Xpress SARS-CoV-2/FLU/RSV plus assay is intended as an aid in the diagnosis of influenza from Nasopharyngeal swab specimens and should not be used as a sole basis for treatment. Nasal washings and aspirates are unacceptable for Xpert Xpress SARS-CoV-2/FLU/RSV testing.  Fact Sheet for Patients: bloggercourse.com  Fact Sheet for Healthcare Providers: seriousbroker.it  This test is not yet approved or cleared by the [place]  FDA and has been authorized for detection and/or diagnosis of SARS-CoV-2 by FDA under an Emergency Use Authorization (EUA). This EUA will remain in effect (meaning this test can be used) for the duration of the COVID-19 declaration under Section 564(b)(1) of  the Act, 21 U.S.C. section 360bbb-3(b)(1), unless the authorization is terminated or revoked.     Resp Syncytial Virus by PCR NEGATIVE NEGATIVE Final    Comment: (NOTE) Fact Sheet for Patients: bloggercourse.com  Fact Sheet for Healthcare Providers: seriousbroker.it  This test is not yet approved or cleared by the [place]  FDA and has been authorized for detection and/or diagnosis of SARS-CoV-2 by FDA under an Emergency Use Authorization (EUA). This EUA will remain in effect (meaning this test can be used) for the duration of the COVID-19 declaration under Section 564(b)(1) of the Act, 21 U.S.C. section 360bbb-3(b)(1), unless the authorization is terminated or revoked.  Performed at [facility], [address] [place], [place] [address]   Blood culture (routine x 2)     Status: None (Preliminary result)   Collection Time: 06/20/24 11:13 PM   Specimen: BLOOD  Result Value Ref Range Status   Specimen Description BLOOD SITE NOT SPECIFIED  Final   Special Requests   Final    BOTTLES DRAWN AEROBIC AND ANAEROBIC Blood Culture adequate volume   Culture   Final    NO GROWTH 4 DAYS Performed at [facility], [address] [place], [place] [address]    Report Status PENDING  Incomplete  Blood culture (routine x 2)     Status: None (Preliminary result)   Collection Time: 06/21/24 12:38 PM   Specimen: BLOOD LEFT HAND  Result Value Ref Range Status   Specimen Description BLOOD LEFT HAND  Final   Special Requests   Final    BOTTLES DRAWN AEROBIC ONLY Blood Culture results may not be optimal due to an inadequate volume of blood received in culture bottles   Culture   Final    NO GROWTH 3 DAYS Performed at [facility], [address] [place], [place] [address]    Report Status PENDING  Incomplete    FURTHER DISCHARGE INSTRUCTIONS:  Get Medicines reviewed and adjusted: Please take all your  medications with you for your next visit with your Primary MD  Laboratory/radiological data: Please request your Primary MD to go over all hospital tests and procedure/radiological results at the follow up, please ask your Primary MD to get all Hospital records sent to his/her office.  In some cases, they will be blood work, cultures and biopsy results pending at the time of your discharge. Please request that your primary care M.D. goes through all the records of your hospital data and follows up on these results.  Also Note the following: If you experience worsening of your admission symptoms, develop shortness of breath,  life threatening emergency, suicidal or homicidal thoughts you must seek medical attention immediately by calling 911 or calling your MD immediately  if symptoms less severe.  You must read complete instructions/literature along with all the possible adverse reactions/side effects for all the Medicines you take and that have been prescribed to you. Take any new Medicines after you have completely understood and accpet all the possible adverse reactions/side effects.   Do not drive when taking Pain medications or sleeping medications (Benzodaizepines)  Do not take more than prescribed Pain, Sleep and Anxiety Medications. It is not advisable to combine anxiety,sleep and pain medications without talking with your primary care practitioner  Special Instructions: If you have smoked or chewed Tobacco  in the last 2 yrs please stop smoking, stop any regular Alcohol  and or any Recreational drug use.  Wear Seat belts while driving.  Please note: You were cared for by a hospitalist during your hospital stay. Once you are discharged, your primary care physician will handle any further medical issues. Please note that NO REFILLS for any discharge medications will be authorized once you are discharged, as it is imperative that you return to your primary care physician (or establish a  relationship with a primary care physician if you do not have one) for your post hospital discharge needs so that they can reassess your need for medications and monitor your lab values.  Total Time spent coordinating discharge including counseling, education and face to face time equals greater than 30 minutes.  SignedBETHA Donalda Applebaum 06/24/2024 6:03 PM

## 2024-06-24 NOTE — Progress Notes (Signed)
 Brief note Informed by nursing staff that patient/patient's mother is requesting discharge-and that family will provide very close supervision and come to the ED if she develops any worsening of her symptoms.  She has been tolerating a full liquid diet and has been very comfortable for the past 3 days.   I reached out to ENT MD-Dr. Vandegriend-spoke on the phone with him-unfortunately-sometimes this can have a protracted clinical course, he suggest that if patient is stable-okay to discharge patient on some tapering steroids.  Subsequently had a long conversation with both patient/patient's mom, explained need for close supervision for by family (mom will be with patient 24/7) explained that if patient develops any worsening symptoms-shortness of breath/stridor-to seek immediate medical attention.  Will discharge patient home today at her own request-see discharge summary for further details.

## 2024-06-25 LAB — CULTURE, BLOOD (ROUTINE X 2)
Culture: NO GROWTH
Special Requests: ADEQUATE

## 2024-06-26 LAB — CULTURE, BLOOD (ROUTINE X 2): Culture: NO GROWTH

## 2024-09-04 ENCOUNTER — Encounter (HOSPITAL_COMMUNITY): Payer: Self-pay | Admitting: Pharmacy Technician

## 2024-09-04 ENCOUNTER — Emergency Department (HOSPITAL_COMMUNITY)
Admission: EM | Admit: 2024-09-04 | Discharge: 2024-09-04 | Disposition: A | Discharge status: Home / Self Care | Attending: Emergency Medicine | Admitting: Emergency Medicine

## 2024-09-04 ENCOUNTER — Other Ambulatory Visit: Payer: Self-pay

## 2024-09-04 DIAGNOSIS — R519 Headache, unspecified: Secondary | ICD-10-CM | POA: Diagnosis present

## 2024-09-04 DIAGNOSIS — R059 Cough, unspecified: Secondary | ICD-10-CM | POA: Diagnosis not present

## 2024-09-04 DIAGNOSIS — J351 Hypertrophy of tonsils: Secondary | ICD-10-CM | POA: Diagnosis not present

## 2024-09-04 DIAGNOSIS — J069 Acute upper respiratory infection, unspecified: Secondary | ICD-10-CM | POA: Diagnosis not present

## 2024-09-04 LAB — BASIC METABOLIC PANEL WITH GFR
Anion gap: 16 — ABNORMAL HIGH (ref 5–15)
BUN: 8 mg/dL (ref 6–20)
CO2: 20 mmol/L — ABNORMAL LOW (ref 22–32)
Calcium: 9.8 mg/dL (ref 8.9–10.3)
Chloride: 97 mmol/L — ABNORMAL LOW (ref 98–111)
Creatinine, Ser: 0.58 mg/dL (ref 0.44–1.00)
GFR, Estimated: >60 mL/min
Glucose, Bld: 79 mg/dL (ref 70–99)
Potassium: 4 mmol/L (ref 3.5–5.1)
Sodium: 133 mmol/L — ABNORMAL LOW (ref 135–145)

## 2024-09-04 LAB — CBC WITH DIFFERENTIAL/PLATELET
Abs Immature Granulocytes: 0.04 K/uL (ref 0.00–0.07)
Basophils Absolute: 0.1 K/uL (ref 0.0–0.1)
Basophils Relative: 1 %
Eosinophils Absolute: 0 K/uL (ref 0.0–0.5)
Eosinophils Relative: 0 %
HCT: 40 % (ref 36.0–46.0)
Hemoglobin: 12.9 g/dL (ref 12.0–15.0)
Immature Granulocytes: 0 %
Lymphocytes Relative: 12 %
Lymphs Abs: 1.2 K/uL (ref 0.7–4.0)
MCH: 27.5 pg (ref 26.0–34.0)
MCHC: 32.3 g/dL (ref 30.0–36.0)
MCV: 85.3 fL (ref 80.0–100.0)
Monocytes Absolute: 0.6 K/uL (ref 0.1–1.0)
Monocytes Relative: 5 %
Neutro Abs: 8.9 K/uL — ABNORMAL HIGH (ref 1.7–7.7)
Neutrophils Relative %: 82 %
Platelets: 250 K/uL (ref 150–400)
RBC: 4.69 MIL/uL (ref 3.87–5.11)
RDW: 12.1 % (ref 11.5–15.5)
WBC: 10.8 K/uL — ABNORMAL HIGH (ref 4.0–10.5)
nRBC: 0 % (ref 0.0–0.2)

## 2024-09-04 LAB — RESP PANEL BY RT-PCR (RSV, FLU A&B, COVID)  RVPGX2
Influenza A by PCR: NEGATIVE
Influenza B by PCR: NEGATIVE
Resp Syncytial Virus by PCR: NEGATIVE
SARS Coronavirus 2 by RT PCR: NEGATIVE

## 2024-09-04 LAB — MONONUCLEOSIS SCREEN: Mono Screen: NEGATIVE

## 2024-09-04 LAB — GROUP A STREP BY PCR: Group A Strep by PCR: NOT DETECTED

## 2024-09-04 MED ORDER — PREDNISONE 10 MG (21) PO TBPK: ORAL_TABLET | Freq: Every day | ORAL | 0 refills | Status: AC

## 2024-09-04 MED ORDER — DEXAMETHASONE 1 MG/ML PO CONC
10.0000 mg | Freq: Once | ORAL | Status: AC
  Administered 2024-09-04: 10 mg via ORAL
  Filled 2024-09-04 (×2): qty 10

## 2024-09-04 NOTE — Discharge Instructions (Signed)
 Continue taking medications as prescribed, use warm salt water gargles to help clean the mouth as well as to reduce swelling in the back of the throat, and follow-up with your primary care in the next week.  Return to the emergency room if you have increased swelling, or difficulty breathing that develops as a result.

## 2024-09-04 NOTE — ED Triage Notes (Signed)
 Pt here with reports of headache, decreased PO intake, fever and swollen lymph nodes since yesterday. Dx with Mono 2 months ago.

## 2024-09-04 NOTE — ED Provider Notes (Signed)
 " [place] [facility] DE[facility]MENT AT [facility] Provider Note   CSN: [id number] Arrival date & time: 09/04/24  1202     Patient presents with: Headache   Renee [name] is a 19 y.o. female presents today with primary concern of decreased oral intake secondary to swelling in the throat and on the neck since yesterday.  She previously had been diagnosed with mononucleosis 2 months prior, also does endorse headache.  Denies any nuchal rigidity, denies any shortness of breath, does have an intermittent cough.  {Add pertinent medical, surgical, social history, OB history to HPI:32947}  Headache Associated symptoms: sore throat        Prior to Admission medications  Medication Sig Start Date End Date Taking? Authorizing Provider  Etonogestrel (NEXPLANON [place]) Inject 1 Device into the skin continuous.    [provider]  ibuprofen  (ADVIL ) 200 MG tablet Take 200-800 mg by mouth every 6 (six) hours as needed for moderate pain (pain score 4-6).    [provider]  ondansetron  (ZOFRAN ) 4 MG tablet Take 1 tablet (4 mg total) by mouth every 8 (eight) hours as needed for nausea or vomiting. 06/18/24   Rogelia Jerilynn RAMAN, MD  predniSONE  (DELTASONE ) 10 MG tablet Take 40 mg daily for 2 days, 30 mg daily for 2 days, 20 mg daily for 2 days,10 mg daily for 1 days, then stop 06/24/24   Raenelle Donalda HERO, MD    Allergies: Patient has no known allergies.    Review of Systems  HENT:  Positive for sore throat. Negative for trouble swallowing.   Neurological:  Positive for headaches.  All other systems reviewed and are negative.   Updated Vital Signs BP 115/67 (BP Location: Right Arm)   Pulse (!) 101   Temp 99.4 F (37.4 C) (Oral)   Resp 16   Ht 5' 2 (1.575 m)   Wt 49.9 kg   SpO2 100%   BMI 20.12 kg/m   Physical Exam Vitals and nursing note reviewed.  Constitutional:      General: She is not in acute distress.    Appearance: Normal appearance.  HENT:     Head:  Normocephalic and atraumatic.     Right Ear: Tympanic membrane, ear canal and external ear normal.     Left Ear: Tympanic membrane, ear canal and external ear normal.     Mouth/Throat:     Mouth: Mucous membranes are moist.     Pharynx: Uvula midline. Pharyngeal swelling and posterior oropharyngeal erythema present.     Tonsils: 3+ on the right. 2+ on the left.  Eyes:     Extraocular Movements: Extraocular movements intact.     Conjunctiva/sclera: Conjunctivae normal.     Pupils: Pupils are equal, round, and reactive to light.  Neck:     Trachea: Trachea normal.     Meningeal: Kernig's sign absent.  Cardiovascular:     Rate and Rhythm: Normal rate and regular rhythm.     Pulses: Normal pulses.     Heart sounds: Normal heart sounds. No murmur heard.    No friction rub. No gallop.  Pulmonary:     Effort: Pulmonary effort is normal.     Breath sounds: Normal breath sounds.  Abdominal:     General: Abdomen is flat. Bowel sounds are normal.     Palpations: Abdomen is soft.  Musculoskeletal:        General: Normal range of motion.     Cervical back: Normal range of motion and neck  supple.     Right lower leg: No edema.     Left lower leg: No edema.  Lymphadenopathy:     Cervical: Cervical adenopathy present.     Right cervical: Superficial cervical adenopathy present. No posterior cervical adenopathy.    Left cervical: Superficial cervical adenopathy present. No posterior cervical adenopathy.  Skin:    General: Skin is warm and dry.     Capillary Refill: Capillary refill takes less than 2 seconds.  Neurological:     General: No focal deficit present.     Mental Status: She is alert. Mental status is at baseline.  Psychiatric:        Mood and Affect: Mood normal.     (all labs ordered are listed, but only abnormal results are displayed) Labs Reviewed  BASIC METABOLIC PANEL WITH GFR - Abnormal; Notable for the following components:      Result Value   Sodium 133 (*)     Chloride 97 (*)    CO2 20 (*)    Anion gap 16 (*)    All other components within normal limits  CBC WITH DIFFERENTIAL/PLATELET - Abnormal; Notable for the following components:   WBC 10.8 (*)    Neutro Abs 8.9 (*)    All other components within normal limits  GROUP A STREP BY PCR  RESP PANEL BY RT-PCR (RSV, FLU A&B, COVID)  [id number]  MONONUCLEOSIS SCREEN    EKG: None  Radiology: No results found.  {Document cardiac monitor, telemetry assessment procedure when appropriate:32947} Procedures   Medications Ordered in the ED  dexamethasone  (DECADRON ) 1 MG/ML solution 10 mg (has no administration in time range)      {Click here for ABCD2, HEART and other calculators REFRESH Note before signing:1}                              Medical Decision Making Amount and/or Complexity of Data Reviewed Labs: ordered.  Risk Prescription drug management.   ***  {Document critical care time when appropriate  Document review of labs and clinical decision tools ie CHADS2VASC2, etc  Document your independent review of radiology images and any outside records  Document your discussion with family members, caretakers and with consultants  Document social determinants of health affecting pt's care  Document your decision making why or why not admission, treatments were needed:32947:::1}   Final diagnoses:  None    ED Discharge Orders     None        "
